# Patient Record
Sex: Male | Born: 1960
Health system: Southern US, Community
[De-identification: ages and names within clinical notes are randomized; demographics above are authoritative.]

## PROBLEM LIST (undated history)

## (undated) DIAGNOSIS — I10 Essential (primary) hypertension: Secondary | ICD-10-CM

## (undated) DIAGNOSIS — Z789 Other specified health status: Secondary | ICD-10-CM

## (undated) DIAGNOSIS — I119 Hypertensive heart disease without heart failure: Secondary | ICD-10-CM

## (undated) DIAGNOSIS — K611 Rectal abscess: Secondary | ICD-10-CM

## (undated) DIAGNOSIS — I700 Atherosclerosis of aorta: Secondary | ICD-10-CM

## (undated) DIAGNOSIS — M1990 Unspecified osteoarthritis, unspecified site: Secondary | ICD-10-CM

## (undated) DIAGNOSIS — J432 Centrilobular emphysema: Secondary | ICD-10-CM

## (undated) DIAGNOSIS — K644 Residual hemorrhoidal skin tags: Secondary | ICD-10-CM

## (undated) DIAGNOSIS — M545 Low back pain, unspecified: Secondary | ICD-10-CM

## (undated) DIAGNOSIS — D696 Thrombocytopenia, unspecified: Secondary | ICD-10-CM

## (undated) DIAGNOSIS — F1090 Alcohol use, unspecified, uncomplicated: Secondary | ICD-10-CM

## (undated) DIAGNOSIS — E785 Hyperlipidemia, unspecified: Secondary | ICD-10-CM

## (undated) DIAGNOSIS — F17200 Nicotine dependence, unspecified, uncomplicated: Secondary | ICD-10-CM

## (undated) DIAGNOSIS — G8929 Other chronic pain: Secondary | ICD-10-CM

## (undated) DIAGNOSIS — I2510 Atherosclerotic heart disease of native coronary artery without angina pectoris: Secondary | ICD-10-CM

## (undated) HISTORY — DX: Hyperlipidemia, unspecified: E78.5

## (undated) HISTORY — DX: Thrombocytopenia, unspecified: D69.6

## (undated) HISTORY — DX: Alcohol use, unspecified, uncomplicated: F10.90

## (undated) HISTORY — DX: Residual hemorrhoidal skin tags: K64.4

## (undated) HISTORY — DX: Nicotine dependence, unspecified, uncomplicated: F17.200

## (undated) HISTORY — DX: Centrilobular emphysema: J43.2

## (undated) HISTORY — DX: Essential (primary) hypertension: I10

## (undated) HISTORY — DX: Other chronic pain: G89.29

## (undated) HISTORY — DX: Low back pain, unspecified: M54.50

## (undated) HISTORY — DX: Unspecified osteoarthritis, unspecified site: M19.90

## (undated) HISTORY — DX: Atherosclerosis of aorta: I70.0

## (undated) HISTORY — DX: Hypertensive heart disease without heart failure: I11.9

## (undated) HISTORY — DX: Atherosclerotic heart disease of native coronary artery without angina pectoris: I25.10

## (undated) HISTORY — DX: Other specified health status: Z78.9

---

## 2002-12-10 ENCOUNTER — Emergency Department (HOSPITAL_COMMUNITY): Admission: EM | Admit: 2002-12-10 | Discharge: 2002-12-11 | Payer: Self-pay | Admitting: Emergency Medicine

## 2003-06-17 ENCOUNTER — Inpatient Hospital Stay (HOSPITAL_COMMUNITY): Admission: EM | Admit: 2003-06-17 | Discharge: 2003-06-20 | Payer: Self-pay | Admitting: Emergency Medicine

## 2005-11-24 ENCOUNTER — Encounter: Admission: RE | Admit: 2005-11-24 | Discharge: 2005-11-24 | Payer: Self-pay | Admitting: Family Medicine

## 2008-11-25 ENCOUNTER — Encounter: Admission: RE | Admit: 2008-11-25 | Discharge: 2008-11-25 | Payer: Self-pay | Admitting: Family Medicine

## 2008-12-11 ENCOUNTER — Ambulatory Visit: Payer: Self-pay | Admitting: Hematology & Oncology

## 2009-01-26 ENCOUNTER — Ambulatory Visit: Payer: Self-pay | Admitting: Hematology & Oncology

## 2009-02-12 LAB — CBC WITH DIFFERENTIAL (CANCER CENTER ONLY)
BASO#: 0 10*3/uL (ref 0.0–0.2)
BASO%: 0.7 % (ref 0.0–2.0)
EOS%: 1.1 % (ref 0.0–7.0)
HGB: 15.8 g/dL (ref 13.0–17.1)
LYMPH#: 1.8 10*3/uL (ref 0.9–3.3)
MCHC: 35.7 g/dL (ref 32.0–35.9)
MONO%: 12.3 % (ref 0.0–13.0)
NEUT#: 2.3 10*3/uL (ref 1.5–6.5)
Platelets: 142 10*3/uL — ABNORMAL LOW (ref 145–400)
RDW: 10.8 % (ref 10.5–14.6)

## 2009-02-12 LAB — TECHNOLOGIST REVIEW CHCC SATELLITE

## 2009-02-12 LAB — CHCC SATELLITE - SMEAR

## 2009-02-15 LAB — ANA: Anti Nuclear Antibody(ANA): NEGATIVE

## 2009-06-03 ENCOUNTER — Ambulatory Visit: Payer: Self-pay | Admitting: Hematology & Oncology

## 2012-02-20 ENCOUNTER — Encounter: Payer: Self-pay | Admitting: Family Medicine

## 2012-02-20 NOTE — Progress Notes (Signed)
This encounter was created in error - please disregard.

## 2012-04-09 ENCOUNTER — Emergency Department (INDEPENDENT_AMBULATORY_CARE_PROVIDER_SITE_OTHER)
Admission: EM | Admit: 2012-04-09 | Discharge: 2012-04-09 | Disposition: A | Discharge status: Home / Self Care | Payer: Self-pay | Source: Home / Self Care | Attending: Emergency Medicine | Admitting: Emergency Medicine

## 2012-04-09 ENCOUNTER — Emergency Department (INDEPENDENT_AMBULATORY_CARE_PROVIDER_SITE_OTHER): Payer: Self-pay

## 2012-04-09 ENCOUNTER — Encounter (HOSPITAL_COMMUNITY): Payer: Self-pay

## 2012-04-09 DIAGNOSIS — M5412 Radiculopathy, cervical region: Secondary | ICD-10-CM

## 2012-04-09 MED ORDER — MELOXICAM 7.5 MG PO TABS: 7.5000 mg | ORAL_TABLET | Freq: Every day | ORAL | Status: DC

## 2012-04-09 MED ORDER — CYCLOBENZAPRINE HCL 10 MG PO TABS: 10.0000 mg | ORAL_TABLET | Freq: Two times a day (BID) | ORAL | Status: DC | PRN

## 2012-04-09 NOTE — ED Notes (Signed)
2 week history of reported neck pain w/o known injury; NAD; cannot sleep on stomach as per his usual habit

## 2012-04-09 NOTE — ED Provider Notes (Addendum)
And her History     CSN: [id number]  Arrival date & time 04/09/12  1207   First MD Initiated Contact with Patient 04/09/12 1302      Chief Complaint  Patient presents with  . Neck Pain    (Consider location/radiation/quality/duration/timing/severity/associated sxs/prior treatment) HPI Comments: Patient presents urgent care this afternoon complaining of ongoing posterior neck pain radiates to the upper portion of his left shoulder in the lateral posterior aspect of his left upper arm. Patient describes that he's been hurting for approximately 2 weeks pain is exacerbated with movement neck rotation. Patient denies associated symptoms such as chest pains, shortness of breath, weakness. Patient denies any recent injuries or falls. Have been taken over-the-counter Aleve for the pain and discomfort. Patient denies constitutional symptoms such as fevers, unintentional weight loss, arthralgias myalgias or changes in appetite. Denies any previous injuries or fractures to his neck.  Patient is a 52 y.o. male presenting with neck pain. The history is provided by the patient.  Neck Pain  This is a new problem. The current episode started more than 1 week ago. The problem occurs constantly. The problem has been gradually worsening. There has been no fever. The pain is present in the left side. The quality of the pain is described as shooting. The pain is at a severity of 8/10. The pain is moderate. The symptoms are aggravated by bending and twisting (Moving his neck). Pertinent negatives include no photophobia, no visual change, no chest pain, no numbness, no bowel incontinence, no bladder incontinence, no paresis, no tingling and no weakness. Treatments tried: aleve.    Past Medical History  Diagnosis Date  . Arthritis   . HTN (hypertension)     History reviewed. No pertinent past surgical history.  Family History  Problem Relation Age of Onset  . Cancer Mother     Lung and breast  .  Hypertension Mother   . Heart failure Father   . Hypertension Father     History  Substance Use Topics  . Smoking status: Not on file  . Smokeless tobacco: Not on file  . Alcohol Use: Not on file      Review of Systems  Constitutional: Positive for activity change. Negative for chills, diaphoresis, appetite change and fatigue.  HENT: Positive for neck pain. Negative for ear pain, facial swelling, neck stiffness and tinnitus.   Eyes: Negative for photophobia.  Cardiovascular: Negative for chest pain.  Gastrointestinal: Negative for bowel incontinence.  Genitourinary: Negative for bladder incontinence.  Skin: Negative for color change, pallor, rash and wound.  Neurological: Negative for dizziness, tingling, facial asymmetry, weakness and numbness.    Allergies  Review of patient's allergies indicates no known allergies.  Home Medications   Current Outpatient Rx  Name  Route  Sig  Dispense  Refill  . AMLODIPINE BESYLATE-VALSARTAN 10-160 MG PO TABS   Oral   Take 1 tablet by mouth daily.         . CYCLOBENZAPRINE HCL 10 MG PO TABS   Oral   Take 1 tablet (10 mg total) by mouth 2 (two) times daily as needed for muscle spasms.   20 tablet   0   . MELOXICAM 7.5 MG PO TABS   Oral   Take 1 tablet (7.5 mg total) by mouth daily.   14 tablet   0     BP 197/100  Pulse 78  Temp 99.1 F (37.3 C) (Oral)  Resp 18  Ht 6' (1.829 m)  Wt 195  lb (88.451 kg)  BMI 26.45 kg/m2  SpO2 97%  Physical Exam  Nursing note and vitals reviewed. Constitutional: He is oriented to person, place, and time. Vital signs are normal. He appears well-developed and well-nourished.  Neck: Trachea normal and normal range of motion. Neck supple. No JVD present. Spinous process tenderness and muscular tenderness present. Carotid bruit is not present. No rigidity. No edema and no erythema present. No Brudzinski's sign and no Kernig's sign noted. No mass present.  Cardiovascular: Normal rate.  Exam  reveals no gallop and no friction rub.   No murmur heard. Pulmonary/Chest: Effort normal and breath sounds normal.  Musculoskeletal: He exhibits no tenderness.       Back:  Lymphadenopathy:    He has no cervical adenopathy.  Neurological: He is alert and oriented to person, place, and time. He displays normal reflexes. No cranial nerve deficit or sensory deficit. He exhibits normal muscle tone. Coordination normal.  Skin: Skin is warm. No rash noted. No erythema. No pallor.    ED Course  Procedures (including critical care time)  Labs Reviewed - No data to display Dg Cervical Spine Complete  04/09/2012  *RADIOLOGY REPORT*  Clinical Data: 52 year old male with neck pain radiating down left arm.  No known trauma.  CERVICAL SPINE - COMPLETE 4+ VIEW  Comparison: None  Findings: Reversal of the normal cervical lordosis is identified. There is no evidence of acute fracture, subluxation or prevertebral soft tissue swelling. Mild to moderate degenerative disc disease/spondylosis is present at C4-C5 and C5-C6 with mild to moderate left bony foraminal narrowing at C4-C5. No focal bony lesions are identified.  IMPRESSION: No evidence of acute bony abnormality.  Mild to moderate degenerative changes at C4-C5 and C5-C6 with mild to moderate left bony foraminal narrowing at C4-C5.   Original Report Authenticated By: Harmon Pier, M.D.      1. Cervical radiculopathy at C6       MDM   Non-trauma- cervical pain DJD- Cervical mild foramina narrowing C4-C5- NO vascular or muscular deficits-visual prescribing Cox 2 inhibitor along with muscle relaxer. Otherwise to followup with primary care Dr. for further followup and perhaps imaging study is to further correct her is a potential cervical radiculopathy. Patient does not have any muscular or vascular deficits.  Jimmie Molly, MD 04/09/12 [phone]

## 2012-04-30 ENCOUNTER — Encounter (HOSPITAL_COMMUNITY): Payer: Self-pay

## 2012-04-30 ENCOUNTER — Emergency Department (INDEPENDENT_AMBULATORY_CARE_PROVIDER_SITE_OTHER)
Admission: EM | Admit: 2012-04-30 | Discharge: 2012-04-30 | Disposition: A | Discharge status: Home / Self Care | Payer: Self-pay | Source: Home / Self Care | Attending: Family Medicine | Admitting: Family Medicine

## 2012-04-30 DIAGNOSIS — K602 Anal fissure, unspecified: Secondary | ICD-10-CM

## 2012-04-30 DIAGNOSIS — K6289 Other specified diseases of anus and rectum: Secondary | ICD-10-CM

## 2012-04-30 MED ORDER — HYDROCODONE-ACETAMINOPHEN 5-325 MG PO TABS: 1.0000 | ORAL_TABLET | Freq: Three times a day (TID) | ORAL | Status: DC | PRN

## 2012-04-30 MED ORDER — DOCUSATE SODIUM 100 MG PO CAPS: 100.0000 mg | ORAL_CAPSULE | Freq: Two times a day (BID) | ORAL | Status: DC

## 2012-04-30 NOTE — ED Notes (Signed)
Patient states has a lump on the base of his spine that flarred up last night Pain is a number 9

## 2012-04-30 NOTE — ED Provider Notes (Signed)
History   CSN: [id number]  Arrival date & time 04/30/12  1527   First MD Initiated Contact with Patient 04/30/12 1626     Chief Complaint  Patient presents with  . Tailbone Pain   HPI Pt says that he is having anal pain for one week.  He reports that he started developing pain when he was placed on Mobic for an arthritis condition.  He took Mobic for about 14 days.  He reported that he has had pain for the last week and his anus that has gotten worse.  He reports now he is having pain with defecation.  He reported that he had initially developed a rash on his anus a couple of days ago before the severe pain started.  He reports that he has not been having a difficult time having bowel movements but reports that he is having pain with defecation that started last evening.  He reports that he hasn't been able to sleep well because of pain in the anus.  He reports no blood in the stool.  No black tarry stool.  No hematemesis.  Patient denies any trauma to the anal canal.  Past Medical History  Diagnosis Date  . Arthritis   . HTN (hypertension)     History reviewed. No pertinent past surgical history.  Family History  Problem Relation Age of Onset  . Cancer Mother     Lung and breast  . Hypertension Mother   . Heart failure Father   . Hypertension Father     History  Substance Use Topics  . Smoking status: Not on file  . Smokeless tobacco: Not on file  . Alcohol Use: Not on file    Review of Systems  Gastrointestinal: Positive for diarrhea and rectal pain. Negative for nausea, vomiting, abdominal pain, constipation, blood in stool, abdominal distention and anal bleeding.  All other systems reviewed and are negative.   Allergies  Review of patient's allergies indicates no known allergies.  Home Medications   Current Outpatient Rx  Name  Route  Sig  Dispense  Refill  . amLODipine-valsartan (EXFORGE) 10-160 MG per tablet   Oral   Take 1 tablet by mouth daily.         .  cyclobenzaprine (FLEXERIL) 10 MG tablet   Oral   Take 1 tablet (10 mg total) by mouth 2 (two) times daily as needed for muscle spasms.   20 tablet   0   . meloxicam (MOBIC) 7.5 MG tablet   Oral   Take 1 tablet (7.5 mg total) by mouth daily.   14 tablet   0     BP 138/81  Pulse 76  Temp(Src) 98.3 F (36.8 C) (Oral)  SpO2 98%  Physical Exam  Nursing note and vitals reviewed. Constitutional: He is oriented to person, place, and time. He appears well-developed and well-nourished. No distress.  HENT:  Head: Normocephalic and atraumatic.  Eyes: EOM are normal. Pupils are equal, round, and reactive to light.  Neck: Normal range of motion.  Cardiovascular: Normal rate, regular rhythm and normal heart sounds.   Pulmonary/Chest: Effort normal and breath sounds normal.  Abdominal: Soft. Bowel sounds are normal. He exhibits no distension and no mass. There is no tenderness. There is no rebound and no guarding.  Genitourinary:  Anal exam: No external lesions seen, small anal fissure noted at 6:00 position, guaiac-negative, no external hemorrhoids or internal hemorrhoids could be palpated, patient has significant pain during the exam at the area of  the fissure  Musculoskeletal: Normal range of motion.  Neurological: He is alert and oriented to person, place, and time. He has normal reflexes.  Skin: Skin is warm and dry.  Psychiatric: He has a normal mood and affect. His behavior is normal. Judgment and thought content normal.    ED Course  Procedures (including critical care time)  Labs Reviewed - No data to display No results found.   No diagnosis found.   MDM  IMPRESSION  Anal fissure   Anal Pain   Proctitis  RECOMMENDATIONS / PLAN High Fiber Diet Sitz Baths twice a day Hydrocodone acetaminophen 5/325 one by mouth q. 6 hours when necessary severe pain Discontinue Mobic as this may have caused ulceration in the GI tract Colace 100 mg po bid Increase fiber in diet   RTC or go to ER if symptoms don't improve or worsen over next 2 days .  Pt and wife verbalized understanding.   FOLLOW UP On Monday for recheck  The patient was given clear instructions to go to ER or return to medical center if symptoms don't improve, worsen or new problems develop.  The patient verbalized understanding.  The patient was told to call to get lab results if they haven't heard anything in the next week.            Cleora Fleet, MD 04/30/12 2005

## 2012-05-06 NOTE — ED Notes (Signed)
Per dr Laural Benes if patient is still in that much pain suggest he be seen in the ED so he can get a surgeon consult for what is going on in his anus area

## 2012-10-13 ENCOUNTER — Encounter (HOSPITAL_COMMUNITY): Payer: Self-pay | Admitting: *Deleted

## 2012-10-13 ENCOUNTER — Inpatient Hospital Stay (HOSPITAL_COMMUNITY)
Admission: EM | Admit: 2012-10-13 | Discharge: 2012-10-16 | DRG: 349 | Disposition: A | Discharge status: Home / Self Care | Payer: MEDICAID | Attending: General Surgery | Admitting: General Surgery

## 2012-10-13 DIAGNOSIS — K612 Anorectal abscess: Principal | ICD-10-CM | POA: Diagnosis present

## 2012-10-13 DIAGNOSIS — I10 Essential (primary) hypertension: Secondary | ICD-10-CM | POA: Diagnosis present

## 2012-10-13 DIAGNOSIS — K611 Rectal abscess: Secondary | ICD-10-CM

## 2012-10-13 DIAGNOSIS — K6289 Other specified diseases of anus and rectum: Secondary | ICD-10-CM

## 2012-10-13 MED ORDER — MORPHINE SULFATE 4 MG/ML IJ SOLN
4.0000 mg | Freq: Once | INTRAMUSCULAR | Status: AC
  Administered 2012-10-14: 4 mg via INTRAVENOUS
  Filled 2012-10-13: qty 1

## 2012-10-13 NOTE — ED Notes (Signed)
The pt reports that he has swelling and pain in   His rectum today.  He has also had the same in the past

## 2012-10-13 NOTE — ED Provider Notes (Signed)
CSN: [id number]     Arrival date & time 10/13/12  2224 History     First MD Initiated Contact with Patient 10/13/12 2300     Chief Complaint  Patient presents with  . Hemorrhoids   (Consider location/radiation/quality/duration/timing/severity/associated sxs/prior Treatment) HPI History provided by pt.   52yo M w/ h/o HTN, otherwise healthy, presents w/ 2 days of rectal pain.  Aggravated by sitting and having a BM.  Associated w/ difficulty initiating urinary stream and weakened stream.  Denies fever, abd pain, N/V, hematochezia/melena, dysuria, hematuria and urethral discharge.  Per prior chart, seen for similar sx at [facility] on 04/30/12 and was diagnosed w/ anal fissure and proctitis.  No h/o prostatitis or BPH.   Past Medical History  Diagnosis Date  . Arthritis   . HTN (hypertension)    History reviewed. No pertinent past surgical history. Family History  Problem Relation Age of Onset  . Cancer Mother     Lung and breast  . Hypertension Mother   . Heart failure Father   . Hypertension Father    History  Substance Use Topics  . Smoking status: Not on file  . Smokeless tobacco: Not on file  . Alcohol Use: Not on file    Review of Systems  All other systems reviewed and are negative.    Allergies  Review of patient's allergies indicates no known allergies.  Home Medications   Current Outpatient Rx  Name  Route  Sig  Dispense  Refill  . amLODipine-valsartan (EXFORGE) 10-160 MG per tablet   Oral   Take 1 tablet by mouth daily.         . hydrocortisone cream 1 %   Topical   Apply 1 application topically 2 (two) times daily.         Marland Kitchen ibuprofen (ADVIL,MOTRIN) 200 MG tablet   Oral   Take 200 mg by mouth every 6 (six) hours as needed for pain.          BP 148/89  Pulse 89  Temp(Src) 98.3 F (36.8 C) (Oral)  Resp 16  SpO2 97% Physical Exam  Nursing note and vitals reviewed. Constitutional: He is oriented to person, place, and time. He appears  well-developed and well-nourished. No distress.  HENT:  Head: Normocephalic and atraumatic.  Eyes:  Normal appearance  Neck: Normal range of motion.  Cardiovascular: Normal rate and regular rhythm.   Pulmonary/Chest: Effort normal and breath sounds normal. No respiratory distress.  Abdominal: Soft. Bowel sounds are normal. He exhibits no distension and no mass. There is no tenderness. There is no rebound and no guarding.  Genitourinary:  No CVA tenderness.  No external hemorrhoids, fissures or other perianal skin changes.  Induration to left of anus and left medial buttock that can also be appreciated on DRE.  Severe pain on DRE.  Unable to assess prostate.  Nml stool color.    Musculoskeletal: Normal range of motion.  Neurological: He is alert and oriented to person, place, and time.  Skin: Skin is warm and dry. No rash noted.  Psychiatric: He has a normal mood and affect. His behavior is normal.    ED Course   Procedures (including critical care time)  Labs Reviewed  CBC WITH DIFFERENTIAL - Abnormal; Notable for the following:    Hemoglobin 17.2 (*)    Monocytes Relative 15 (*)    Monocytes Absolute 1.4 (*)    All other components within normal limits  COMPREHENSIVE METABOLIC PANEL - Abnormal; Notable for the  following:    Glucose, Bld 109 (*)    Albumin 3.0 (*)    All other components within normal limits  URINALYSIS, ROUTINE W REFLEX MICROSCOPIC   Ct Pelvis W Contrast  10/14/2012   *RADIOLOGY REPORT*  Clinical Data:  Hemorrhoids.  Pain and swelling in the rectum.  CT PELVIS WITH CONTRAST  Technique:  Multidetector CT imaging of the pelvis was performed using the standard protocol following the bolus administration of intravenous contrast.  Contrast: OMNIPAQUE IOHEXOL 300 MG/ML  SOLN  Comparison:   None.  Findings:  The dominant abnormality of the fluid collection which appears intramural in the rectum.  The there is no extension to the skin surface identified.  On axial  imaging, this measures 23 mm x 17 mm.  Craniocaudal extent is 43 mm.  This appears to be in the posterior rectal wall.  Inflammatory changes of the distal sigmoid and proximal rectum are present.  Inflammatory changes of the more superior rectum are present with stranding in the pararectal fat. There is no colonic obstruction.  Visualized ascending and descending colon appear normal.  Small bowel appears normal. Vasculature demonstrates atherosclerosis without aneurysm or acute abnormality.  Probable right varicocele.  Visualized portions of the kidneys appear within normal limits.  No aggressive osseous lesions.  Moderate bilateral hip osteoarthritis.  IMPRESSION:  1.  Fluid collection in the inferior rectal wall most compatible with abscess.  No definite extension to the perineum. 2.  Proctitis.   Original Report Authenticated By: Andreas Newport, M.D.   1. Rectal abscess   2. Proctitis     MDM  51yo immunocompetent M presents w/ 2 days of rectal pain w/ associated urinary hesitancy and weakened stream.  On exam, afebrile, NAD, abd benign, induration and severe tenderness just left of anus and left/posterior wall of rectum.  Unable to assess the prostate d/t pain level on DRE.  CT pelvis ordered for further evaluation; suspect perirectal abscess.  U/A pending as well.  Pt to receive morphine for pain.  12:00 AM   CT shows inferior rectal wall abscess and proctitis.  Consulted Dr. Corliss Skains who requested that Dr. Preston Fleeting evaluate.  Dr. Preston Fleeting requested that he see patient in ED.  Pt aware of diagnosis.  His pain is currently controlled. 2:00 AM   Otilio Miu, PA-C 10/14/12 0201

## 2012-10-14 ENCOUNTER — Encounter (HOSPITAL_COMMUNITY): Payer: Self-pay | Admitting: Anesthesiology

## 2012-10-14 ENCOUNTER — Encounter (HOSPITAL_COMMUNITY): Admission: EM | Disposition: A | Discharge status: Home / Self Care | Payer: Self-pay | Source: Home / Self Care

## 2012-10-14 ENCOUNTER — Emergency Department (HOSPITAL_COMMUNITY): Payer: Self-pay

## 2012-10-14 ENCOUNTER — Observation Stay (HOSPITAL_COMMUNITY): Payer: Self-pay | Admitting: Anesthesiology

## 2012-10-14 ENCOUNTER — Encounter (HOSPITAL_COMMUNITY): Payer: Self-pay | Admitting: Radiology

## 2012-10-14 DIAGNOSIS — K612 Anorectal abscess: Secondary | ICD-10-CM

## 2012-10-14 DIAGNOSIS — K611 Rectal abscess: Secondary | ICD-10-CM

## 2012-10-14 HISTORY — PX: INCISION AND DRAINAGE PERIRECTAL ABSCESS: SHX1804

## 2012-10-14 LAB — CBC WITH DIFFERENTIAL/PLATELET
Hemoglobin: 17.2 g/dL — ABNORMAL HIGH (ref 13.0–17.0)
Lymphocytes Relative: 14 % (ref 12–46)
Lymphs Abs: 1.3 10*3/uL (ref 0.7–4.0)
Monocytes Relative: 15 % — ABNORMAL HIGH (ref 3–12)
Neutro Abs: 6.7 10*3/uL (ref 1.7–7.7)
Neutrophils Relative %: 71 % (ref 43–77)
Platelets: 162 10*3/uL (ref 150–400)
RBC: 5.18 MIL/uL (ref 4.22–5.81)
WBC: 9.4 10*3/uL (ref 4.0–10.5)

## 2012-10-14 LAB — COMPREHENSIVE METABOLIC PANEL
ALT: 11 U/L (ref 0–53)
Alkaline Phosphatase: 71 U/L (ref 39–117)
BUN: 10 mg/dL (ref 6–23)
Chloride: 103 mEq/L (ref 96–112)
GFR calc Af Amer: >90 mL/min (ref >90)
Glucose, Bld: 109 mg/dL — ABNORMAL HIGH (ref 70–99)
Potassium: 4.4 mEq/L (ref 3.5–5.1)
Sodium: 138 mEq/L (ref 135–145)
Total Bilirubin: 0.7 mg/dL (ref 0.3–1.2)
Total Protein: 7.1 g/dL (ref 6.0–8.3)

## 2012-10-14 LAB — URINALYSIS, ROUTINE W REFLEX MICROSCOPIC
Bilirubin Urine: NEGATIVE
Glucose, UA: NEGATIVE mg/dL
Ketones, ur: 15 mg/dL — AB
Leukocytes, UA: NEGATIVE
Nitrite: NEGATIVE
Protein, ur: NEGATIVE mg/dL
pH: 5.5 (ref 5.0–8.0)

## 2012-10-14 LAB — URINE MICROSCOPIC-ADD ON

## 2012-10-14 LAB — SURGICAL PCR SCREEN
MRSA, PCR: NEGATIVE
Staphylococcus aureus: NEGATIVE

## 2012-10-14 SURGERY — INCISION AND DRAINAGE, ABSCESS, PERIRECTAL
Anesthesia: General | Site: Buttocks | Wound class: Dirty or Infected

## 2012-10-14 MED ORDER — OXYCODONE HCL 5 MG/5ML PO SOLN: 5.0000 mg | Freq: Once | ORAL | Status: AC | PRN

## 2012-10-14 MED ORDER — SODIUM CHLORIDE 0.9 % IV SOLN
INTRAVENOUS | Status: DC
  Administered 2012-10-14 – 2012-10-15 (×4): via INTRAVENOUS

## 2012-10-14 MED ORDER — ACETAMINOPHEN 325 MG PO TABS
ORAL_TABLET | ORAL | Status: AC
  Administered 2012-10-14: 650 mg via ORAL
  Filled 2012-10-14: qty 2

## 2012-10-14 MED ORDER — LIDOCAINE HCL (CARDIAC) 20 MG/ML IV SOLN
INTRAVENOUS | Status: DC | PRN
  Administered 2012-10-14: 80 mg via INTRAVENOUS

## 2012-10-14 MED ORDER — IOHEXOL 300 MG/ML  SOLN
100.0000 mL | Freq: Once | INTRAMUSCULAR | Status: AC | PRN
  Administered 2012-10-14: 100 mL via INTRAVENOUS

## 2012-10-14 MED ORDER — ONDANSETRON HCL 4 MG/2ML IJ SOLN
INTRAMUSCULAR | Status: DC | PRN
  Administered 2012-10-14: 4 mg via INTRAVENOUS

## 2012-10-14 MED ORDER — CIPROFLOXACIN IN D5W 400 MG/200ML IV SOLN
400.0000 mg | Freq: Two times a day (BID) | INTRAVENOUS | Status: DC
  Administered 2012-10-14 – 2012-10-16 (×5): 400 mg via INTRAVENOUS
  Filled 2012-10-14 (×6): qty 200

## 2012-10-14 MED ORDER — METRONIDAZOLE IN NACL 5-0.79 MG/ML-% IV SOLN
500.0000 mg | Freq: Three times a day (TID) | INTRAVENOUS | Status: DC
  Administered 2012-10-14 – 2012-10-16 (×7): 500 mg via INTRAVENOUS
  Filled 2012-10-14 (×9): qty 100

## 2012-10-14 MED ORDER — PROPOFOL 10 MG/ML IV BOLUS
INTRAVENOUS | Status: DC | PRN
  Administered 2012-10-14: 200 mg via INTRAVENOUS

## 2012-10-14 MED ORDER — LACTATED RINGERS IV SOLN
INTRAVENOUS | Status: DC | PRN
  Administered 2012-10-14: 09:00:00 via INTRAVENOUS

## 2012-10-14 MED ORDER — IRBESARTAN 150 MG PO TABS
150.0000 mg | ORAL_TABLET | Freq: Every day | ORAL | Status: DC
  Administered 2012-10-15 – 2012-10-16 (×2): 150 mg via ORAL
  Filled 2012-10-14 (×3): qty 1

## 2012-10-14 MED ORDER — PANTOPRAZOLE SODIUM 40 MG IV SOLR
40.0000 mg | Freq: Every day | INTRAVENOUS | Status: DC
  Administered 2012-10-14: 40 mg via INTRAVENOUS
  Filled 2012-10-14 (×3): qty 40

## 2012-10-14 MED ORDER — BUPIVACAINE-EPINEPHRINE PF 0.25-1:200000 % IJ SOLN
INTRAMUSCULAR | Status: AC
  Filled 2012-10-14: qty 30

## 2012-10-14 MED ORDER — PHENYLEPHRINE HCL 10 MG/ML IJ SOLN
INTRAMUSCULAR | Status: DC | PRN
  Administered 2012-10-14 (×2): 80 ug via INTRAVENOUS

## 2012-10-14 MED ORDER — AMLODIPINE BESYLATE-VALSARTAN 10-160 MG PO TABS: 1.0000 | ORAL_TABLET | Freq: Every day | ORAL | Status: DC

## 2012-10-14 MED ORDER — HYDROMORPHONE HCL PF 1 MG/ML IJ SOLN: 0.2500 mg | INTRAMUSCULAR | Status: DC | PRN

## 2012-10-14 MED ORDER — ONDANSETRON HCL 4 MG/2ML IJ SOLN: 4.0000 mg | Freq: Four times a day (QID) | INTRAMUSCULAR | Status: DC | PRN

## 2012-10-14 MED ORDER — BUPIVACAINE-EPINEPHRINE 0.25% -1:200000 IJ SOLN
INTRAMUSCULAR | Status: DC | PRN
  Administered 2012-10-14: 20 mL

## 2012-10-14 MED ORDER — 0.9 % SODIUM CHLORIDE (POUR BTL) OPTIME
TOPICAL | Status: DC | PRN
  Administered 2012-10-14: 1000 mL

## 2012-10-14 MED ORDER — HYDROMORPHONE HCL PF 1 MG/ML IJ SOLN
1.0000 mg | INTRAMUSCULAR | Status: DC | PRN
  Administered 2012-10-15 (×2): 1 mg via INTRAVENOUS
  Filled 2012-10-14 (×2): qty 1

## 2012-10-14 MED ORDER — FENTANYL CITRATE 0.05 MG/ML IJ SOLN
INTRAMUSCULAR | Status: DC | PRN
  Administered 2012-10-14: 100 ug via INTRAVENOUS
  Administered 2012-10-14 (×2): 50 ug via INTRAVENOUS

## 2012-10-14 MED ORDER — KETOROLAC TROMETHAMINE 30 MG/ML IJ SOLN
INTRAMUSCULAR | Status: DC | PRN
  Administered 2012-10-14: 30 mg via INTRAVENOUS

## 2012-10-14 MED ORDER — AMLODIPINE BESYLATE 10 MG PO TABS
10.0000 mg | ORAL_TABLET | Freq: Every day | ORAL | Status: DC
  Administered 2012-10-15 – 2012-10-16 (×2): 10 mg via ORAL
  Filled 2012-10-14 (×3): qty 1

## 2012-10-14 MED ORDER — ACETAMINOPHEN 325 MG PO TABS: 650.0000 mg | ORAL_TABLET | Freq: Four times a day (QID) | ORAL | Status: AC | PRN

## 2012-10-14 MED ORDER — MIDAZOLAM HCL 5 MG/5ML IJ SOLN
INTRAMUSCULAR | Status: DC | PRN
  Administered 2012-10-14: 2 mg via INTRAVENOUS

## 2012-10-14 MED ORDER — OXYCODONE HCL 5 MG PO TABS
5.0000 mg | ORAL_TABLET | ORAL | Status: DC | PRN
  Administered 2012-10-14 – 2012-10-16 (×8): 10 mg via ORAL
  Filled 2012-10-14 (×2): qty 1
  Filled 2012-10-14 (×7): qty 2

## 2012-10-14 MED ORDER — OXYCODONE HCL 5 MG PO TABS: 5.0000 mg | ORAL_TABLET | Freq: Once | ORAL | Status: AC | PRN

## 2012-10-14 SURGICAL SUPPLY — 39 items
BLADE SURG 15 STRL LF DISP TIS (BLADE) ×1 IMPLANT
BLADE SURG 15 STRL SS (BLADE) ×2
CANISTER SUCTION 2500CC (MISCELLANEOUS) ×2 IMPLANT
CLEANER TIP ELECTROSURG 2X2 (MISCELLANEOUS) IMPLANT
CLOTH BEACON ORANGE TIMEOUT ST (SAFETY) ×2 IMPLANT
COVER SURGICAL LIGHT HANDLE (MISCELLANEOUS) ×2 IMPLANT
DRAPE UTILITY 15X26 W/TAPE STR (DRAPE) ×4 IMPLANT
DRSG PAD ABDOMINAL 8X10 ST (GAUZE/BANDAGES/DRESSINGS) ×2 IMPLANT
ELECT REM PT RETURN 9FT ADLT (ELECTROSURGICAL)
ELECTRODE REM PT RTRN 9FT ADLT (ELECTROSURGICAL) IMPLANT
GAUZE PACKING IODOFORM 1 (PACKING) ×2 IMPLANT
GAUZE SPONGE 4X4 16PLY XRAY LF (GAUZE/BANDAGES/DRESSINGS) ×2 IMPLANT
GLOVE BIO SURGEON STRL SZ7.5 (GLOVE) ×2 IMPLANT
GLOVE BIO SURGEON STRL SZ8 (GLOVE) ×2 IMPLANT
GLOVE BIOGEL PI IND STRL 7.5 (GLOVE) ×1 IMPLANT
GLOVE BIOGEL PI IND STRL 8 (GLOVE) ×1 IMPLANT
GLOVE BIOGEL PI INDICATOR 7.5 (GLOVE) ×1
GLOVE BIOGEL PI INDICATOR 8 (GLOVE) ×1
GOWN STRL NON-REIN LRG LVL3 (GOWN DISPOSABLE) ×4 IMPLANT
GOWN STRL REIN XL XLG (GOWN DISPOSABLE) ×2 IMPLANT
KIT BASIN OR (CUSTOM PROCEDURE TRAY) ×2 IMPLANT
KIT ROOM TURNOVER OR (KITS) ×2 IMPLANT
NEEDLE HYPO 25GX1X1/2 BEV (NEEDLE) ×2 IMPLANT
NS IRRIG 1000ML POUR BTL (IV SOLUTION) ×2 IMPLANT
PACK LITHOTOMY IV (CUSTOM PROCEDURE TRAY) ×2 IMPLANT
PAD ARMBOARD 7.5X6 YLW CONV (MISCELLANEOUS) ×4 IMPLANT
PENCIL BUTTON HOLSTER BLD 10FT (ELECTRODE) IMPLANT
SPONGE GAUZE 4X4 12PLY (GAUZE/BANDAGES/DRESSINGS) ×2 IMPLANT
SWAB COLLECTION DEVICE MRSA (MISCELLANEOUS) ×2 IMPLANT
SYR BULB 3OZ (MISCELLANEOUS) ×2 IMPLANT
SYR CONTROL 10ML LL (SYRINGE) ×2 IMPLANT
TAPE CLOTH SURG 6X10 WHT LF (GAUZE/BANDAGES/DRESSINGS) ×2 IMPLANT
TOWEL OR 17X24 6PK STRL BLUE (TOWEL DISPOSABLE) ×2 IMPLANT
TOWEL OR 17X26 10 PK STRL BLUE (TOWEL DISPOSABLE) ×2 IMPLANT
TUBE ANAEROBIC SPECIMEN COL (MISCELLANEOUS) ×2 IMPLANT
TUBE CONNECTING 12X1/4 (SUCTIONS) ×2 IMPLANT
UNDERPAD 30X30 INCONTINENT (UNDERPADS AND DIAPERS) ×2 IMPLANT
WATER STERILE IRR 1000ML POUR (IV SOLUTION) ×2 IMPLANT
YANKAUER SUCT BULB TIP NO VENT (SUCTIONS) ×2 IMPLANT

## 2012-10-14 NOTE — Transfer of Care (Signed)
Immediate Anesthesia Transfer of Care Note  Patient: Eric Nielsen  Procedure(s) Performed: Procedure(s): IRRIGATION AND DEBRIDEMENT PERIRECTAL ABSCESS (N/A)  Patient Location: PACU  Anesthesia Type:General  Level of Consciousness: awake, alert  and patient cooperative  Airway & Oxygen Therapy: Patient Spontanous Breathing and Patient connected to nasal cannula oxygen  Post-op Assessment: Report given to PACU RN and Post -op Vital signs reviewed and stable  Post vital signs: Reviewed and stable  Complications: No apparent anesthesia complications

## 2012-10-14 NOTE — Op Note (Addendum)
10/13/2012 - 10/14/2012  9:55 AM  PATIENT:  Eric Nielsen  52 y.o. male  PRE-OPERATIVE DIAGNOSIS:  Left Peri Rectal Abscess  POST-OPERATIVE DIAGNOSIS:  Left Peri Rectal Abscess  PROCEDURE:  Procedure(s): IRRIGATION AND DEBRIDEMENT PERIRECTAL ABSCESS  SURGEON:  Surgeon(s): Liz Malady, MD  PHYSICIAN ASSISTANT:   ASSISTANTS: none   ANESTHESIA:   local and general  EBL:  Total I/O In: 0  Out: 650 [Urine:600; Blood:50]  BLOOD ADMINISTERED:none  DRAINS: none   SPECIMEN:  No Specimen  DISPOSITION OF SPECIMEN:  N/A  COUNTS:  YES  DICTATION: .Dragon Dictation  Patient presents for incision and drainage of perirectal abscess. Informed consent was obtained. He is on IV antibiotic protocol. He was brought to the operating room and general endotracheal anesthesia was administered by the anesthesia staff. His placed in lithotomy position.. A region was prepped and draped in sterile fashion. We did time out procedure. Examination revealed a fluctuant area to the left side of his anal opening. This corresponded with some fullness felt towards the left posterior region on digital rectal exam as well. Linear incision was made on the left side in the perirectal region. Subcutaneous tissues were dissected down and quickly entered a large cavity. This was filled with bloody purulent material. This was sent for culture. Some loculations in the cavity were broken up. The wound was then copiously irrigated. Hemostasis in the subcutaneous tissues was obtained with cautery.Local anesthetic was injected liberally around the wound. The cavity was then packed with one-inch iodoform gauze. There was good hemostasis. A sterile dressing was placed. All counts were correct.He tolerated procedure without apparent complication and was taken recovery in stable condition.  PATIENT DISPOSITION:  PACU - hemodynamically stable.   Delay start of Pharmacological VTE agent (>24hrs) due to surgical blood loss or  risk of bleeding:  no  Violeta Gelinas, MD, MPH, FACS Pager: [phone]  7/28/20149:55 AM

## 2012-10-14 NOTE — Progress Notes (Signed)
  Subjective: Pain perirectal area on L  Objective: Vital signs in last 24 hours: Temp:  [98.3 F (36.8 C)-99 F (37.2 C)] 99 F (37.2 C) (07/28 0430) Pulse Rate:  [71-89] 85 (07/28 0430) Resp:  [16-18] 18 (07/28 0430) BP: (134-163)/(72-98) 153/90 mmHg (07/28 0430) SpO2:  [96 %-100 %] 99 % (07/28 0430)    Intake/Output from previous day:   Intake/Output this shift:    General appearance: alert and cooperative Resp: clear to auscultation bilaterally Cardio: regular rate and rhythm GI: soft, NT palpable induration L posterior perirectal area  Lab Results:   Recent Labs  10/13/12 2354  WBC 9.4  HGB 17.2*  HCT 48.2  PLT 162   BMET  Recent Labs  10/13/12 2354  NA 138  K 4.4  CL 103  CO2 30  GLUCOSE 109*  BUN 10  CREATININE 0.99  CALCIUM 9.4   PT/INR No results found for this basename: LABPROT, INR,  in the last 72 hours ABG No results found for this basename: PHART, PCO2, PO2, HCO3,  in the last 72 hours  Studies/Results: Ct Pelvis W Contrast  10/14/2012   *RADIOLOGY REPORT*  Clinical Data:  Hemorrhoids.  Pain and swelling in the rectum.  CT PELVIS WITH CONTRAST  Technique:  Multidetector CT imaging of the pelvis was performed using the standard protocol following the bolus administration of intravenous contrast.  Contrast: OMNIPAQUE IOHEXOL 300 MG/ML  SOLN  Comparison:   None.  Findings:  The dominant abnormality of the fluid collection which appears intramural in the rectum.  The there is no extension to the skin surface identified.  On axial imaging, this measures 23 mm x 17 mm.  Craniocaudal extent is 43 mm.  This appears to be in the posterior rectal wall.  Inflammatory changes of the distal sigmoid and proximal rectum are present.  Inflammatory changes of the more superior rectum are present with stranding in the pararectal fat. There is no colonic obstruction.  Visualized ascending and descending colon appear normal.  Small bowel appears normal.  Vasculature demonstrates atherosclerosis without aneurysm or acute abnormality.  Probable right varicocele.  Visualized portions of the kidneys appear within normal limits.  No aggressive osseous lesions.  Moderate bilateral hip osteoarthritis.  IMPRESSION:  1.  Fluid collection in the inferior rectal wall most compatible with abscess.  No definite extension to the perineum. 2.  Proctitis.   Original Report Authenticated By: Andreas Newport, M.D.    Anti-infectives: Anti-infectives   Start     Dose/Rate Route Frequency Ordered Stop   10/14/12 0600  metroNIDAZOLE (FLAGYL) IVPB 500 mg     500 mg 100 mL/hr over 60 Minutes Intravenous Every 8 hours 10/14/12 0439     10/14/12 0500  ciprofloxacin (CIPRO) IVPB 400 mg     400 mg 200 mL/hr over 60 Minutes Intravenous Every 12 hours 10/14/12 0439        Assessment/Plan: Perirectal abscess - for I&D in the OR this AM. Procedure, risks, benefits D/W patient. He agrees.  We also discussed the expected post-op course.  LOS: 1 day    Gailene Youkhana E 10/14/2012

## 2012-10-14 NOTE — Preoperative (Signed)
Beta Blockers   Reason not to administer Beta Blockers:Not Applicable 

## 2012-10-14 NOTE — ED Notes (Signed)
Family at bedside. 

## 2012-10-14 NOTE — Anesthesia Procedure Notes (Signed)
Procedure Name: LMA Insertion Date/Time: 10/14/2012 9:31 AM Performed by: Leona Singleton A Pre-anesthesia Checklist: Patient identified, Emergency Drugs available, Suction available and Patient being monitored Patient Re-evaluated:Patient Re-evaluated prior to inductionOxygen Delivery Method: Circle system utilized Preoxygenation: Pre-oxygenation with 100% oxygen Intubation Type: IV induction Ventilation: Mask ventilation without difficulty LMA: LMA inserted LMA Size: 5.0 Tube type: Oral Number of attempts: 1 Placement Confirmation: positive ETCO2 and breath sounds checked- equal and bilateral Tube secured with: Tape Dental Injury: Teeth and Oropharynx as per pre-operative assessment

## 2012-10-14 NOTE — Anesthesia Postprocedure Evaluation (Signed)
  Anesthesia Post-op Note  Patient: Eric Nielsen  Procedure(s) Performed: Procedure(s): IRRIGATION AND DEBRIDEMENT PERIRECTAL ABSCESS (N/A)  Patient Location: PACU  Anesthesia Type:General  Level of Consciousness: awake, alert  and oriented  Airway and Oxygen Therapy: Patient Spontanous Breathing and Patient connected to nasal cannula oxygen  Post-op Pain: none  Post-op Assessment: Post-op Vital signs reviewed, Patient's Cardiovascular Status Stable, Respiratory Function Stable, Patent Airway and No signs of Nausea or vomiting  Post-op Vital Signs: Reviewed and stable  Complications: No apparent anesthesia complications

## 2012-10-14 NOTE — H&P (Signed)
Eric Nielsen is an 52 y.o. male.   Chief Complaint: rectal pain HPI: 52 yo male with HTN presents with three days of worsening rectal pain.  The pain is only present when he sits down or when he tries to have a bowel movement.  He has had some difficulty with urination as well.  He came to the ED for evaluation - afebrile, nl WBC.  CT scan shows a perirectal, possibly intramural abscess in the posterior wall of the rectum.  No clear extension to the perineal skin.  He had a similar episode in February of 2014 and was diagnosed with an anal fissure.  That pain resolved within a few days.  Past Medical History  Diagnosis Date  . Arthritis   . HTN (hypertension)     History reviewed. No pertinent past surgical history.  Family History  Problem Relation Age of Onset  . Cancer Mother     Lung and breast  . Hypertension Mother   . Heart failure Father   . Hypertension Father    Social History:  has no tobacco, alcohol, and drug history on file.  Allergies: No Known Allergies  Prior to Admission medications   Medication Sig Start Date End Date Taking? Authorizing Provider  amLODipine-valsartan (EXFORGE) 10-160 MG per tablet Take 1 tablet by mouth daily.   Yes Historical Provider, MD  hydrocortisone cream 1 % Apply 1 application topically 2 (two) times daily.   Yes Historical Provider, MD  ibuprofen (ADVIL,MOTRIN) 200 MG tablet Take 200 mg by mouth every 6 (six) hours as needed for pain.   Yes Historical Provider, MD    Results for orders placed during the hospital encounter of 10/13/12 (from the past 48 hour(s))  CBC WITH DIFFERENTIAL     Status: Abnormal   Collection Time    10/13/12 11:54 PM      Result Value Range   WBC 9.4  4.0 - 10.5 K/uL   RBC 5.18  4.22 - 5.81 MIL/uL   Hemoglobin 17.2 (*) 13.0 - 17.0 g/dL   HCT 16.1  09.6 - 04.5 %   MCV 93.1  78.0 - 100.0 fL   MCH 33.2  26.0 - 34.0 pg   MCHC 35.7  30.0 - 36.0 g/dL   RDW 40.9  81.1 - 91.4 %   Platelets 162  150 - 400  K/uL   Neutrophils Relative % 71  43 - 77 %   Neutro Abs 6.7  1.7 - 7.7 K/uL   Lymphocytes Relative 14  12 - 46 %   Lymphs Abs 1.3  0.7 - 4.0 K/uL   Monocytes Relative 15 (*) 3 - 12 %   Monocytes Absolute 1.4 (*) 0.1 - 1.0 K/uL   Eosinophils Relative 1  0 - 5 %   Eosinophils Absolute 0.1  0.0 - 0.7 K/uL   Basophils Relative 0  0 - 1 %   Basophils Absolute 0.0  0.0 - 0.1 K/uL  COMPREHENSIVE METABOLIC PANEL     Status: Abnormal   Collection Time    10/13/12 11:54 PM      Result Value Range   Sodium 138  135 - 145 mEq/L   Potassium 4.4  3.5 - 5.1 mEq/L   Chloride 103  96 - 112 mEq/L   CO2 30  19 - 32 mEq/L   Glucose, Bld 109 (*) 70 - 99 mg/dL   BUN 10  6 - 23 mg/dL   Creatinine, Ser 7.82  0.50 - 1.35 mg/dL  Calcium 9.4  8.4 - 10.5 mg/dL   Total Protein 7.1  6.0 - 8.3 g/dL   Albumin 3.0 (*) 3.5 - 5.2 g/dL   AST 15  0 - 37 U/L   ALT 11  0 - 53 U/L   Alkaline Phosphatase 71  39 - 117 U/L   Total Bilirubin 0.7  0.3 - 1.2 mg/dL   GFR calc non Af Amer >90  >90 mL/min   GFR calc Af Amer >90  >90 mL/min   Comment:            The eGFR has been calculated     using the CKD EPI equation.     This calculation has not been     validated in all clinical     situations.     eGFR's persistently     <90 mL/min signify     possible Chronic Kidney Disease.   Ct Pelvis W Contrast  10/14/2012   *RADIOLOGY REPORT*  Clinical Data:  Hemorrhoids.  Pain and swelling in the rectum.  CT PELVIS WITH CONTRAST  Technique:  Multidetector CT imaging of the pelvis was performed using the standard protocol following the bolus administration of intravenous contrast.  Contrast: OMNIPAQUE IOHEXOL 300 MG/ML  SOLN  Comparison:   None.  Findings:  The dominant abnormality of the fluid collection which appears intramural in the rectum.  The there is no extension to the skin surface identified.  On axial imaging, this measures 23 mm x 17 mm.  Craniocaudal extent is 43 mm.  This appears to be in the posterior  rectal wall.  Inflammatory changes of the distal sigmoid and proximal rectum are present.  Inflammatory changes of the more superior rectum are present with stranding in the pararectal fat. There is no colonic obstruction.  Visualized ascending and descending colon appear normal.  Small bowel appears normal. Vasculature demonstrates atherosclerosis without aneurysm or acute abnormality.  Probable right varicocele.  Visualized portions of the kidneys appear within normal limits.  No aggressive osseous lesions.  Moderate bilateral hip osteoarthritis.  IMPRESSION:  1.  Fluid collection in the inferior rectal wall most compatible with abscess.  No definite extension to the perineum. 2.  Proctitis.   Original Report Authenticated By: Andreas Newport, M.D.    Review of Systems  Constitutional: Negative for weight loss.  HENT: Negative for hearing loss, ear pain, neck pain, tinnitus and ear discharge.   Eyes: Negative for blurred vision, double vision, photophobia and pain.  Respiratory: Negative for cough, sputum production and shortness of breath.   Cardiovascular: Negative for chest pain.  Gastrointestinal: Negative for nausea, vomiting and abdominal pain.       Rectal pain   Genitourinary: Negative for dysuria, urgency, frequency and flank pain.  Musculoskeletal: Negative for myalgias, back pain, joint pain and falls.  Neurological: Negative for dizziness, tingling, sensory change, focal weakness, loss of consciousness and headaches.  Endo/Heme/Allergies: Does not bruise/bleed easily.  Psychiatric/Behavioral: Negative for depression, memory loss and substance abuse. The patient is not nervous/anxious.     Blood pressure 137/78, pulse 77, temperature 98.6 F (37 C), temperature source Oral, resp. rate 16, SpO2 100.00%. Physical Exam  WDWN in NAD HEENT:  EOMI, sclera anicteric Neck:  No masses, no thyromegaly Lungs:  CTA bilaterally; normal respiratory effort CV:  Regular rate and rhythm; no  murmurs Abd:  +bowel sounds, soft, non-tender, no masses Rectal:  No pain when on his side; tender fluctuance in left posterior perirectal region; very tender  on digital rectal examination posteriorly - firm fluctuance in wall of rectum Ext:  Well-perfused; no edema Skin:  Warm, dry; no sign of jaundice   Assessment/Plan Posterior perirectal/ intramural abscess  Admit for IV antibiotics Keep NPO To OR later today for examination under anesthesia and drainage of abscess.  Cecil Bixby K. 10/14/2012, 2:36 AM

## 2012-10-14 NOTE — ED Notes (Addendum)
Pt completely undressed, into gown, denies pain sob or nausea, VSS, family at [facility], pt alert, NAD, calm, interactive. Unable to give urine sample.

## 2012-10-14 NOTE — ED Notes (Signed)
Last PO intake 1500.

## 2012-10-14 NOTE — ED Provider Notes (Addendum)
52 year old male comes in with three-day history of rectal pain which is worse after a bowel movement. CT head showed an a rectal wall abscess. On exam, there is tenderness induration more prominent to the left of the anus. There is no external fluctuance. Because of proximity to the rectal wall, this will need to be drained by general surgery. Case is discussed with Dr.Tsui who agrees to come and evaluate the patient.   Dione Booze, MD 10/14/12 [phone]  Medical screening examination/treatment/procedure(s) were conducted as a shared visit with non-physician practitioner(s) and myself.  I personally evaluated the patient during the encounter   Dione Booze, MD 10/14/12 [phone]

## 2012-10-14 NOTE — Anesthesia Preprocedure Evaluation (Addendum)
Anesthesia Evaluation  Patient identified by MRN, date of birth, ID band Patient awake    Reviewed: Allergy & Precautions, H&P , NPO status , Patient's Chart, lab work & pertinent test results  Airway Mallampati: II TM Distance: >3 FB Neck ROM: Full    Dental no notable dental hx. (+) Edentulous Upper, Edentulous Lower and Dental Advisory Given   Pulmonary Current Smoker,  breath sounds clear to auscultation  Pulmonary exam normal       Cardiovascular hypertension, Pt. on medications Rhythm:Regular Rate:Normal     Neuro/Psych negative neurological ROS  negative psych ROS   GI/Hepatic negative GI ROS, Neg liver ROS, Perirectal abscess    Endo/Other  negative endocrine ROS  Renal/GU negative Renal ROS  negative genitourinary   Musculoskeletal  (+) Arthritis -, Osteoarthritis,    Abdominal   Peds  Hematology negative hematology ROS (+)   Anesthesia Other Findings   Reproductive/Obstetrics negative OB ROS                        Anesthesia Physical Anesthesia Plan  ASA: II  Anesthesia Plan: General   Post-op Pain Management:    Induction: Intravenous  Airway Management Planned: LMA and Oral ETT  Additional Equipment:   Intra-op Plan:   Post-operative Plan: Extubation in OR  Informed Consent: I have reviewed the patients History and Physical, chart, labs and discussed the procedure including the risks, benefits and alternatives for the proposed anesthesia with the patient or authorized representative who has indicated his/her understanding and acceptance.   Dental advisory given  Plan Discussed with: CRNA  Anesthesia Plan Comments:         Anesthesia Quick Evaluation

## 2012-10-15 ENCOUNTER — Encounter (HOSPITAL_COMMUNITY): Payer: Self-pay | Admitting: General Surgery

## 2012-10-15 MED ORDER — PANTOPRAZOLE SODIUM 40 MG PO TBEC
40.0000 mg | DELAYED_RELEASE_TABLET | Freq: Every day | ORAL | Status: DC
  Administered 2012-10-15: 40 mg via ORAL
  Filled 2012-10-15: qty 1

## 2012-10-15 NOTE — Progress Notes (Signed)
1 Day Post-Op  Subjective: Pt feels better.  Pain under good control.  Has not been out of bed.  Foley.  +flatus, no bm.  Objective: Vital signs in last 24 hours: Temp:  [98.1 F (36.7 C)-103 F (39.4 C)] 98.1 F (36.7 C) (07/29 0517) Pulse Rate:  [73-125] 74 (07/29 0517) Resp:  [17-33] 18 (07/29 0517) BP: (108-202)/(62-100) 110/62 mmHg (07/29 0517) SpO2:  [90 %-100 %] 99 % (07/29 0517) Weight:  [188 lb (85.276 kg)] 188 lb (85.276 kg) (07/28 1734) Last BM Date: 10/13/12  Intake/Output from previous day: 07/28 0701 - 07/29 0700 In: 3485 [P.O.:480; I.V.:2405; IV Piggyback:600] Out: 1650 [Urine:1600; Blood:50] Intake/Output this shift:    General appearance: alert, cooperative, appears stated age and no distress Resp: clear to auscultation bilaterally Cardio: regular rate and rhythm, S1, S2 normal, no murmur, click, rub or gallop GI: soft, non-tender; bowel sounds normal; no masses,  no organomegaly Extremities: extremities normal, atraumatic, no cyanosis or edema  Lab Results:   Recent Labs  10/13/12 2354  WBC 9.4  HGB 17.2*  HCT 48.2  PLT 162   BMET  Recent Labs  10/13/12 2354  NA 138  K 4.4  CL 103  CO2 30  GLUCOSE 109*  BUN 10  CREATININE 0.99  CALCIUM 9.4   PT/INR No results found for this basename: LABPROT, INR,  in the last 72 hours ABG No results found for this basename: PHART, PCO2, PO2, HCO3,  in the last 72 hours  Studies/Results: Ct Pelvis W Contrast  10/14/2012   *RADIOLOGY REPORT*  Clinical Data:  Hemorrhoids.  Pain and swelling in the rectum.  CT PELVIS WITH CONTRAST  Technique:  Multidetector CT imaging of the pelvis was performed using the standard protocol following the bolus administration of intravenous contrast.  Contrast: OMNIPAQUE IOHEXOL 300 MG/ML  SOLN  Comparison:   None.  Findings:  The dominant abnormality of the fluid collection which appears intramural in the rectum.  The there is no extension to the skin surface  identified.  On axial imaging, this measures 23 mm x 17 mm.  Craniocaudal extent is 43 mm.  This appears to be in the posterior rectal wall.  Inflammatory changes of the distal sigmoid and proximal rectum are present.  Inflammatory changes of the more superior rectum are present with stranding in the pararectal fat. There is no colonic obstruction.  Visualized ascending and descending colon appear normal.  Small bowel appears normal. Vasculature demonstrates atherosclerosis without aneurysm or acute abnormality.  Probable right varicocele.  Visualized portions of the kidneys appear within normal limits.  No aggressive osseous lesions.  Moderate bilateral hip osteoarthritis.  IMPRESSION:  1.  Fluid collection in the inferior rectal wall most compatible with abscess.  No definite extension to the perineum. 2.  Proctitis.   Original Report Authenticated By: Andreas Newport, M.D.    Anti-infectives: Anti-infectives   Start     Dose/Rate Route Frequency Ordered Stop   10/14/12 0600  metroNIDAZOLE (FLAGYL) IVPB 500 mg     500 mg 100 mL/hr over 60 Minutes Intravenous Every 8 hours 10/14/12 0439     10/14/12 0500  ciprofloxacin (CIPRO) IVPB 400 mg     400 mg 200 mL/hr over 60 Minutes Intravenous Every 12 hours 10/14/12 0439        Assessment/Plan: Perirectal abscess -s/p I&D 7/28 Dr. Janee Morn -afebrile, normal white count -change dressing today, premedicate with iv pain meds -continue with atbx -ambulate -DC foley -regular diet   LOS:  2 days    Ashok Norris ANP-BC Pager [phone]  10/15/2012 9:28 AM

## 2012-10-15 NOTE — Progress Notes (Signed)
See if can tolerate dressing change with orals. Patient examined and I agree with the assessment and plan  Violeta Gelinas, MD, MPH, FACS Pager: [phone]  10/15/2012 3:34 PM

## 2012-10-15 NOTE — Care Management Note (Signed)
  Page 1 of 1   10/15/2012     3:59:25 PM   CARE MANAGEMENT NOTE 10/15/2012  Patient:  MILON, DETHLOFF   Account Number:  [id number]  Date Initiated:  10/15/2012  Documentation initiated by:  Ronny Flurry  Subjective/Objective Assessment:     Action/Plan:   Anticipated DC Date:  10/15/2012   Anticipated DC Plan:  HOME W HOME HEALTH SERVICES         Choice offered to / List presented to:          [facility] arranged  HH-1 RN      [facility] agency  [facility]   Status of service:  In process, will continue to follow Medicare Important Message given?   (If response is "NO", the following Medicare IM given date fields will be blank) Date Medicare IM given:   Date Additional Medicare IM given:    Discharge Disposition:  HOME W HOME HEALTH SERVICES  Per UR Regulation:    If discussed at Long Length of Stay Meetings, dates discussed:    Comments:   10-15-12 Confirmed face sheet information with patient and his wife . Cell phone number is incorrect . Current cell phone is [phone]. Wife is willing to be shown how to do the dressing changes.  Patient needs PCP . Received permission from patient to email [facility] for follow appointment .  Ronny Flurry RN BSN [phone]

## 2012-10-15 NOTE — Progress Notes (Signed)
Patient's dressing re-inforced to rectal abscess site. Foley removed. Will monitor output and comfort. Plans for going home tomorrow. Eric Nielsen

## 2012-10-16 MED ORDER — METRONIDAZOLE 500 MG PO TABS: 500.0000 mg | ORAL_TABLET | Freq: Three times a day (TID) | ORAL | Status: DC

## 2012-10-16 MED ORDER — CIPROFLOXACIN HCL 500 MG PO TABS: 500.0000 mg | ORAL_TABLET | Freq: Two times a day (BID) | ORAL | Status: DC

## 2012-10-16 MED ORDER — OXYCODONE-ACETAMINOPHEN 5-325 MG PO TABS: 1.0000 | ORAL_TABLET | Freq: Four times a day (QID) | ORAL | Status: DC | PRN

## 2012-10-16 MED ORDER — POLYETHYLENE GLYCOL 3350 17 GM/SCOOP PO POWD: 8.5000 g | Freq: Every day | ORAL | Status: AC

## 2012-10-16 NOTE — Discharge Summary (Signed)
Patient examined and I agree with the assessment and plan  Violeta Gelinas, MD, MPH, FACS Pager: [phone]  10/16/2012 1:11 PM

## 2012-10-16 NOTE — Discharge Summary (Signed)
Physician Discharge Summary  Patient ID: Eric Nielsen MRN: [id number] DOB/AGE: 50/08/1960 50 y.o.  Admit date: 10/13/2012 Discharge date: 10/16/2012  Admitting Diagnosis: Perirectal abscess  Discharge Diagnosis Patient Active Problem List   Diagnosis Date Noted  . Perirectal abscess 10/14/2012    Consultants None  Procedures Incision and drainage of perirectal abscess(Dr. Corliss Skains 10/14/12)  Hospital Course:  Eric Nielsen is a healthy male who presented to [facility] with worsening rectal pain.  Workup showed a perirectal abscess.  Patient was admitted and underwent procedure listed above.  Tolerated procedure well and was transferred to the floor.  Diet was advanced as tolerated.  On POD #1 his dressing was changed, he still had quite a bit of pain and required iv pain medication.  He was transitioned to oral pain medication for dressing changes.  The patient was voiding well, tolerating diet, ambulating well, pain well controlled, vital signs stable, incisions c/d/i and felt stable for discharge home.  Patient will follow up in our office in 2-3 weeks and knows to call with questions or concerns.  He will continue with atbx for 10 more days, case manager provided voucher.  Culture were obtained, did not show any growth to date.    Physical Exam: General:  Alert, NAD, pleasant, comfortable Abd:  Soft, round and non distended.  Perirectal dressing is dry and intact.     Medication List    ASK your doctor about these medications       amLODipine-valsartan 10-160 MG per tablet  Commonly known as:  EXFORGE  Take 1 tablet by mouth daily.     hydrocortisone cream 1 %  Apply 1 application topically 2 (two) times daily.     ibuprofen 200 MG tablet  Commonly known as:  ADVIL,MOTRIN  Take 200 mg by mouth every 6 (six) hours as needed for pain.         Follow-up Information   Follow up with Wynona Luna., MD On 11/11/2012. (APPOINTMENT 2:30PM.  )    Contact information:   [address] [place] [address] [phone]       Signed: Ashok Norris, [facility] [phone]  10/16/2012, 8:52 AM

## 2012-10-16 NOTE — Progress Notes (Signed)
[employer] letter for medication assistance given and explained to patient and his wife. Both voiced understanding . Ronny Flurry RN BSN [phone]

## 2012-10-16 NOTE — Progress Notes (Signed)
Patient discharged with family at 11:15am. Case manager consulted for assistance medications. Discharge instructions provided with voiced understanding.

## 2012-10-17 LAB — CULTURE, ROUTINE-ABSCESS

## 2012-10-19 LAB — ANAEROBIC CULTURE

## 2012-10-25 ENCOUNTER — Inpatient Hospital Stay: Payer: Self-pay

## 2012-10-30 ENCOUNTER — Encounter (INDEPENDENT_AMBULATORY_CARE_PROVIDER_SITE_OTHER): Payer: Self-pay | Admitting: General Surgery

## 2012-10-30 ENCOUNTER — Ambulatory Visit (INDEPENDENT_AMBULATORY_CARE_PROVIDER_SITE_OTHER): Payer: Self-pay | Admitting: General Surgery

## 2012-10-30 VITALS — BP 134/78 | HR 72 | Temp 98.2°F | Resp 14 | Ht 72.0 in | Wt 181.6 lb

## 2012-10-30 DIAGNOSIS — K612 Anorectal abscess: Secondary | ICD-10-CM

## 2012-10-30 DIAGNOSIS — K611 Rectal abscess: Secondary | ICD-10-CM

## 2012-10-30 NOTE — Progress Notes (Signed)
Subjective:     Patient ID: Eric Nielsen, male   DOB: [birth year], 52 y.o.   MRN: [id number]  HPI Patient is status post incision and drainage of perirectal abscess. He is doing much better. He completed his antibiotics. He is no longer having any difficulty passing his urine or passing his bowels. This had been a problem for some time. He is very satisfied and feeling back to normal. His wife has been doing dressing changes.  Review of Systems     Objective:   Physical Exam Wound is very shallow with clean granulation tissue. Small amount of packing was removed. No evidence of ongoing infection. Plain gauze dressing was applied.    Assessment:     Doing very well status post incision and drainage of perirectal abscess    Plan:     Return as needed.  dry gauze until healed. No further packing is required.

## 2012-10-30 NOTE — Patient Instructions (Signed)
Cover With a plain bandage until healed. No further packing is necessary.

## 2012-11-11 ENCOUNTER — Encounter (INDEPENDENT_AMBULATORY_CARE_PROVIDER_SITE_OTHER): Payer: Self-pay | Admitting: Surgery

## 2012-11-15 ENCOUNTER — Telehealth: Payer: Self-pay | Admitting: Family Medicine

## 2012-12-04 ENCOUNTER — Encounter (INDEPENDENT_AMBULATORY_CARE_PROVIDER_SITE_OTHER): Payer: Self-pay | Admitting: General Surgery

## 2012-12-13 NOTE — Telephone Encounter (Signed)
Called pt, left message.

## 2013-04-18 ENCOUNTER — Encounter (HOSPITAL_COMMUNITY): Payer: Self-pay | Admitting: Emergency Medicine

## 2013-04-18 ENCOUNTER — Encounter (HOSPITAL_COMMUNITY)
Admission: EM | Disposition: A | Discharge status: Home / Self Care | Payer: Self-pay | Source: Home / Self Care | Attending: Emergency Medicine

## 2013-04-18 ENCOUNTER — Ambulatory Visit (HOSPITAL_COMMUNITY): Admit: 2013-04-18 | Payer: Self-pay | Admitting: Interventional Cardiology

## 2013-04-18 ENCOUNTER — Emergency Department (HOSPITAL_COMMUNITY)
Admission: EM | Admit: 2013-04-18 | Discharge: 2013-04-18 | Disposition: A | Discharge status: Home / Self Care | Payer: Self-pay | Attending: Emergency Medicine | Admitting: Emergency Medicine

## 2013-04-18 ENCOUNTER — Emergency Department (HOSPITAL_COMMUNITY): Payer: Self-pay

## 2013-04-18 DIAGNOSIS — I219 Acute myocardial infarction, unspecified: Secondary | ICD-10-CM

## 2013-04-18 DIAGNOSIS — F17200 Nicotine dependence, unspecified, uncomplicated: Secondary | ICD-10-CM | POA: Insufficient documentation

## 2013-04-18 DIAGNOSIS — I359 Nonrheumatic aortic valve disorder, unspecified: Secondary | ICD-10-CM

## 2013-04-18 DIAGNOSIS — R55 Syncope and collapse: Secondary | ICD-10-CM

## 2013-04-18 DIAGNOSIS — R5383 Other fatigue: Secondary | ICD-10-CM

## 2013-04-18 DIAGNOSIS — R9431 Abnormal electrocardiogram [ECG] [EKG]: Secondary | ICD-10-CM

## 2013-04-18 DIAGNOSIS — I10 Essential (primary) hypertension: Secondary | ICD-10-CM | POA: Insufficient documentation

## 2013-04-18 DIAGNOSIS — R5381 Other malaise: Secondary | ICD-10-CM | POA: Insufficient documentation

## 2013-04-18 DIAGNOSIS — Z8739 Personal history of other diseases of the musculoskeletal system and connective tissue: Secondary | ICD-10-CM | POA: Insufficient documentation

## 2013-04-18 DIAGNOSIS — Z79899 Other long term (current) drug therapy: Secondary | ICD-10-CM | POA: Insufficient documentation

## 2013-04-18 DIAGNOSIS — R0789 Other chest pain: Secondary | ICD-10-CM | POA: Insufficient documentation

## 2013-04-18 DIAGNOSIS — R61 Generalized hyperhidrosis: Secondary | ICD-10-CM | POA: Insufficient documentation

## 2013-04-18 DIAGNOSIS — R110 Nausea: Secondary | ICD-10-CM | POA: Insufficient documentation

## 2013-04-18 DIAGNOSIS — R0602 Shortness of breath: Secondary | ICD-10-CM | POA: Insufficient documentation

## 2013-04-18 LAB — POCT I-STAT TROPONIN I: TROPONIN I, POC: 0.01 ng/mL (ref 0.00–0.08)

## 2013-04-18 LAB — COMPREHENSIVE METABOLIC PANEL
ALBUMIN: 4.4 g/dL (ref 3.5–5.2)
ALT: 28 U/L (ref 0–53)
AST: 32 U/L (ref 0–37)
Alkaline Phosphatase: 99 U/L (ref 39–117)
BUN: 15 mg/dL (ref 6–23)
CALCIUM: 9.8 mg/dL (ref 8.4–10.5)
CO2: 26 meq/L (ref 19–32)
CREATININE: 1.24 mg/dL (ref 0.50–1.35)
Chloride: 92 mEq/L — ABNORMAL LOW (ref 96–112)
GFR calc Af Amer: 77 mL/min — ABNORMAL LOW (ref >90)
GFR, EST NON AFRICAN AMERICAN: 66 mL/min — AB (ref >90)
Glucose, Bld: 108 mg/dL — ABNORMAL HIGH (ref 70–99)
Potassium: 3.8 mEq/L (ref 3.7–5.3)
SODIUM: 138 meq/L (ref 137–147)
TOTAL PROTEIN: 9.4 g/dL — AB (ref 6.0–8.3)
Total Bilirubin: 0.6 mg/dL (ref 0.3–1.2)

## 2013-04-18 LAB — CBC
HCT: 54.3 % — ABNORMAL HIGH (ref 39.0–52.0)
Hemoglobin: 20 g/dL — ABNORMAL HIGH (ref 13.0–17.0)
MCH: 34.4 pg — ABNORMAL HIGH (ref 26.0–34.0)
MCHC: 36.8 g/dL — ABNORMAL HIGH (ref 30.0–36.0)
MCV: 93.3 fL (ref 78.0–100.0)
Platelets: 161 K/uL (ref 150–400)
RBC: 5.82 MIL/uL — ABNORMAL HIGH (ref 4.22–5.81)
RDW: 13.3 % (ref 11.5–15.5)
WBC: 7.9 K/uL (ref 4.0–10.5)

## 2013-04-18 LAB — POCT I-STAT, CHEM 8
BUN: 16 mg/dL (ref 6–23)
CHLORIDE: 94 meq/L — AB (ref 96–112)
Calcium, Ion: 1.18 mmol/L (ref 1.12–1.23)
Creatinine, Ser: 1.4 mg/dL — ABNORMAL HIGH (ref 0.50–1.35)
Glucose, Bld: 111 mg/dL — ABNORMAL HIGH (ref 70–99)
HCT: 60 % — ABNORMAL HIGH (ref 39.0–52.0)
Hemoglobin: 20.4 g/dL — ABNORMAL HIGH (ref 13.0–17.0)
Potassium: 3.4 mEq/L — ABNORMAL LOW (ref 3.7–5.3)
SODIUM: 140 meq/L (ref 137–147)
TCO2: 28 mmol/L (ref 0–100)

## 2013-04-18 LAB — PROTIME-INR
INR: 1.01 (ref 0.00–1.49)
Prothrombin Time: 13.1 s (ref 11.6–15.2)

## 2013-04-18 LAB — TROPONIN I: Troponin I: <0.3 ng/mL (ref <0.30)

## 2013-04-18 LAB — D-DIMER, QUANTITATIVE: D-Dimer, Quant: 0.48 ug{FEU}/mL (ref 0.00–0.48)

## 2013-04-18 LAB — APTT: aPTT: 32 seconds (ref 24–37)

## 2013-04-18 SURGERY — LEFT HEART CATHETERIZATION WITH CORONARY ANGIOGRAM
Anesthesia: LOCAL

## 2013-04-18 MED ORDER — SODIUM CHLORIDE 0.9 % IV SOLN: INTRAVENOUS | Status: DC

## 2013-04-18 MED ORDER — HEPARIN SODIUM (PORCINE) 5000 UNIT/ML IJ SOLN: 60.0000 [IU]/kg | INTRAMUSCULAR | Status: DC

## 2013-04-18 MED ORDER — SODIUM CHLORIDE 0.9 % IV BOLUS (SEPSIS)
1000.0000 mL | Freq: Once | INTRAVENOUS | Status: AC
  Administered 2013-04-18: 1000 mL via INTRAVENOUS

## 2013-04-18 MED ORDER — ASPIRIN 81 MG PO CHEW: 324.0000 mg | CHEWABLE_TABLET | Freq: Once | ORAL | Status: DC

## 2013-04-18 NOTE — ED Notes (Signed)
Main lab called to add D-dimer to blood already received

## 2013-04-18 NOTE — ED Notes (Signed)
Pt remains pain free, denies SOB, n/v, diaphoresis. Reports prior to near syncopal episode he felt flushed, hot, sweaty, dizzy, lightheaded, nauseated. States vomited prior to EMS arrival. Denies diarrhea.  States had a brief 5 second episode of left side CP enroute to ED, described pain as pressure. Presently denies dizziness, nausea. Only complaint is feeling tired.

## 2013-04-18 NOTE — ED Notes (Signed)
Dr. Chesterfield at bedside.

## 2013-04-18 NOTE — ED Provider Notes (Signed)
7:00 PM Patient is chest pain-free.  His echocardiogram was without wall motion abnormalities.  Troponin x2 negative.  Unlikely to be ACS.  Discharge home in good condition.  Please see cardiology consult note   Hoy Morn, MD 04/18/13 1900

## 2013-04-18 NOTE — ED Notes (Signed)
Report received, assumed care.  

## 2013-04-18 NOTE — Consult Note (Addendum)
  Interventional Cardiology Consult   REASON: STEMI activation   Subjective: The patient is a 53 year old African American male who while eating lunch consisting of "a greasy hamburger" he became nauseated, sweaty, and his girlfriend noted that his eyes rolled back and he fainted. He had no chest pain complaints prior to this episode. EMS was summoned. Upon awakening he had 5 seconds of the left upper parasternal discomfort that was 10 point in location. He is totally pain-free at this time. No nausea or dyspnea is present. He is nervous and tearful. His prior history of fainting on at least one other occasion.  There is a history of hypertension. He does not smoke, denies cocaine and illicit drug use, and is not diabetic. He denies a family history of premature atherosclerosis.  Objective: Blood pressure 121/75 heart rate 92, the patient is in no acute distress and his respiratory rate is 16 HEENT exam reveals the vagina is pallor Neck exam reveals no JVD. No carotid bruits. Chest is clear to auscultation and percussion. No rales or wheezing is heard. There is no palpable chest wall tenderness. Cardiac exam is unremarkable. A pericardial friction rub is not appreciated. The abdomen is unremarkable. There is no tenderness. The extremities reveal no edema. Pedal and radial pulses are symmetric bilaterally. Neurologically the patient is intact. He is alert and oriented. He is anxious and tearful.  ECG: From 2005 demonstrates precordial ST elevation with peak T waves have a slightly slower heart rate. Today's ECG on presentation reveals precordial ST elevation V2 through 4 without reciprocal change ST elevation is more prominent than the tracing from 2005.  Impression: 1. Syncope of uncertain etiology, but with features consistent with vasovagal syncope/neurally mediated syncope. Rule out ventricular tachyarrhythmia versus bradycardia asystole 2. EKG with ST elevation more prominent than on a prior  tracing 10 years ago. I suspect that the ST segment changes are related to early repolarization in absence of any cardiac symptoms at the time of evaluation the  Recommendations: Serial EKG tracings Cycle cardiac markers Call us if chest pain consistent with angina STEMI activation was canceled based upon my clinical belief that the absence of symptoms and a previously documented abnormal/similar EKG rules out acute MI .  Time: 1062 through 1510  The patient was seen at the bedside in the emergency room and continuously managed for the above timeframe until we made our final clinical assessment.    ADDENDUM: 5:50 4 PM The 2-D echocardiogram demonstrates no evidence of anterior wall motion appear. Cardiac markers unremarkable.

## 2013-04-18 NOTE — ED Notes (Signed)
EDP at the bedside.  ?

## 2013-04-18 NOTE — ED Notes (Signed)
ECHO at bedside.

## 2013-04-18 NOTE — ED Notes (Signed)
Cardiologist at the bedside

## 2013-04-18 NOTE — ED Provider Notes (Signed)
CSN: [id number]     Arrival date & time 04/18/13  1438 History   First MD Initiated Contact with Patient 04/18/13 1445     Chief Complaint  Patient presents with  . Code STEMI   (Consider location/radiation/quality/duration/timing/severity/associated sxs/prior Treatment) HPI Comments: Patient was at home eating a cheeseburger when he began to feel hot, sweaty and lightheaded. His girlfriend called EMS. On EMS arrival EKG showed ST segment elevation in anterior lateral leads. He was not having any chest pain or SOB at the time.  On the way to being transported to the hospital, patient of sharp stabbing chest pain in his left chest that lasted for about 5 seconds and did not radiate. The pain is now resolved. STEMI was then called. On arrival to the ED patient is pain-free and denies complaint. He says he feels better. He denies any dizziness, lightheadedness, chest pain, shortness of breath, nausea or vomiting. He states he has a medical history of hypertension. He is a smoker. No previous heart disease. Review of his EKG shows similar to previous in 2005 and code STEMI was canceled.  The history is provided by the patient and the EMS personnel. The history is limited by the condition of the patient.    Past Medical History  Diagnosis Date  . Arthritis   . HTN (hypertension)    Past Surgical History  Procedure Laterality Date  . Incision and drainage perirectal abscess N/A 10/14/2012    Procedure: IRRIGATION AND DEBRIDEMENT PERIRECTAL ABSCESS;  Surgeon: Zenovia Jarred, MD;  Location: [facility];  Service: General;  Laterality: N/A;   Family History  Problem Relation Age of Onset  . Hypertension Mother   . Cancer Mother     Lung and breast and brain  . Heart failure Father   . Hypertension Father    History  Substance Use Topics  . Smoking status: Current Every Day Smoker -- 1.00 packs/day  . Smokeless tobacco: Never Used  . Alcohol Use: Yes    Review of Systems  Constitutional:  Positive for diaphoresis. Negative for fever, activity change and appetite change.  Respiratory: Positive for chest tightness and shortness of breath.   Cardiovascular: Positive for chest pain.  Gastrointestinal: Positive for nausea. Negative for vomiting and abdominal pain.  Genitourinary: Negative for dysuria, urgency and hematuria.  Musculoskeletal: Negative for arthralgias, back pain and myalgias.  Skin: Negative for rash.  Neurological: Positive for weakness. Negative for dizziness, light-headedness and headaches.  A complete 10 system review of systems was obtained and all systems are negative except as noted in the HPI and PMH.    Allergies  Review of patient's allergies indicates no known allergies.  Home Medications   Current Outpatient Rx  Name  Route  Sig  Dispense  Refill  . lisinopril (PRINIVIL,ZESTRIL) 40 MG tablet   Oral   Take 40 mg by mouth daily.          BP 138/75  Pulse 98  Temp(Src) 99.5 F (37.5 C)  Resp 19  Ht 5\' 8"  (1.727 m)  Wt 181 lb (82.101 kg)  BMI 27.53 kg/m2  SpO2 95% Physical Exam  Constitutional: He is oriented to person, place, and time. He appears well-developed and well-nourished. No distress.  HENT:  Head: Normocephalic and atraumatic.  Mouth/Throat: Oropharynx is clear and moist. No oropharyngeal exudate.  Eyes: Conjunctivae and EOM are normal. Pupils are equal, round, and reactive to light.  Neck: Normal range of motion. Neck supple.  Cardiovascular: Normal rate, regular  rhythm and normal heart sounds.   No murmur heard. Pulmonary/Chest: Effort normal and breath sounds normal. No respiratory distress. He has no wheezes.  Abdominal: Soft. There is no tenderness. There is no rebound and no guarding.  Musculoskeletal: He exhibits no edema and no tenderness.  Neurological: He is alert and oriented to person, place, and time. No cranial nerve deficit. He exhibits normal muscle tone. Coordination normal.  CN 2-12 intact, no ataxia on  finger to nose, no nystagmus, 5/5 strength throughout, no pronator drift, Romberg negative, normal gait.   Skin: Skin is warm.    ED Course  Procedures (including critical care time) Labs Review Labs Reviewed  CBC - Abnormal; Notable for the following:    RBC 5.82 (*)    Hemoglobin 20.0 (*)    HCT 54.3 (*)    MCH 34.4 (*)    MCHC 36.8 (*)    All other components within normal limits  COMPREHENSIVE METABOLIC PANEL - Abnormal; Notable for the following:    Chloride 92 (*)    Glucose, Bld 108 (*)    Total Protein 9.4 (*)    GFR calc non Af Amer 66 (*)    GFR calc Af Amer 77 (*)    All other components within normal limits  POCT I-STAT, CHEM 8 - Abnormal; Notable for the following:    Potassium 3.4 (*)    Chloride 94 (*)    Creatinine, Ser 1.40 (*)    Glucose, Bld 111 (*)    Hemoglobin 20.4 (*)    HCT 60.0 (*)    All other components within normal limits  APTT  PROTIME-INR  D-DIMER, QUANTITATIVE  TROPONIN I  POCT I-STAT TROPONIN I   Imaging Review Dg Chest Port 1 View  04/18/2013   CLINICAL DATA:  "Code  STEMI "  EXAM: PORTABLE CHEST - 1 VIEW  COMPARISON:  None.  FINDINGS: The heart size and mediastinal contours are within normal limits. Both lungs are clear. The visualized skeletal structures are unremarkable.  IMPRESSION: No active disease.   Electronically Signed   By: Lahoma Crocker M.D.   On: 04/18/2013 15:01    EKG Interpretation    Date/Time:  Friday April 18 2013 14:42:07 EST Ventricular Rate:  93 PR Interval:  169 QRS Duration: 90 QT Interval:  371 QTC Calculation: 461 R Axis:   -72 Text Interpretation:  Sinus rhythm LAE, consider biatrial enlargement Left anterior fascicular block Lateral leads are also involved Confirmed by Wyvonnia Dusky  MD, Jaivon Vanbeek ([id number]) on 04/18/2013 3:02:48 PM            MDM   1. Syncope   2. Abnormal EKG   3. Near syncope   4. EKG abnormality    Near syncopal episode followed by a brief episode of chest pain now resolved.  Fleeting seconds of chest pain is not consistent with ACS. Patient seen at bedside with Dr. Tamala Julian on arrival. He is feeling better. Code STEMI cancelled. EKG is similar to previous.  Orthostatics positive by HR.  Troponin negative Cr 1.4, Hb 20, likely some element of hemoconcentration and dehydration.  Patient given IVF.  He remains asymptomatic and denies SOB.  CXR and d-dimer negative.  Serial troponins will be obtained on recommendation of Dr. Tamala Julian. Echo pending. Care signed out to Dr. Venora Maples at shift change.  Ezequiel Essex, MD 04/18/13 [phone]

## 2013-04-18 NOTE — ED Notes (Signed)
Pt to department via EMS- pt reports he has a witnessed syncopal episode at home. EMS reports that he started having chest pain in the truck. Pt denies any pain on arrival. 324 ASA 18 left forearm. Bp-110/70 Hr-104 CBG-107

## 2013-04-18 NOTE — ED Notes (Signed)
Pt eating food brought by family ? ?

## 2013-04-18 NOTE — Progress Notes (Signed)
  Echocardiogram 2D Echocardiogram has been performed.  Eric Nielsen 04/18/2013, 5:20 PM

## 2013-11-27 ENCOUNTER — Ambulatory Visit: Payer: Self-pay | Admitting: Family Medicine

## 2013-12-24 ENCOUNTER — Encounter: Payer: Self-pay | Admitting: Family Medicine

## 2013-12-24 ENCOUNTER — Ambulatory Visit (INDEPENDENT_AMBULATORY_CARE_PROVIDER_SITE_OTHER): Payer: Self-pay | Admitting: Family Medicine

## 2013-12-24 VITALS — BP 163/71 | HR 85 | Temp 99.0°F | Ht 69.0 in | Wt 200.4 lb

## 2013-12-24 DIAGNOSIS — Z1159 Encounter for screening for other viral diseases: Secondary | ICD-10-CM

## 2013-12-24 DIAGNOSIS — I10 Essential (primary) hypertension: Secondary | ICD-10-CM | POA: Insufficient documentation

## 2013-12-24 DIAGNOSIS — R739 Hyperglycemia, unspecified: Secondary | ICD-10-CM

## 2013-12-24 DIAGNOSIS — Z1211 Encounter for screening for malignant neoplasm of colon: Secondary | ICD-10-CM | POA: Insufficient documentation

## 2013-12-24 DIAGNOSIS — Z114 Encounter for screening for human immunodeficiency virus [HIV]: Secondary | ICD-10-CM

## 2013-12-24 LAB — CBC
HCT: 46.9 % (ref 39.0–52.0)
Hemoglobin: 16.5 g/dL (ref 13.0–17.0)
MCH: 33 pg (ref 26.0–34.0)
MCHC: 35.2 g/dL (ref 30.0–36.0)
MCV: 93.8 fL (ref 78.0–100.0)
Platelets: 130 10*3/uL — ABNORMAL LOW (ref 150–400)
RBC: 5 MIL/uL (ref 4.22–5.81)
RDW: 13.9 % (ref 11.5–15.5)
WBC: 4.8 10*3/uL (ref 4.0–10.5)

## 2013-12-24 LAB — COMPREHENSIVE METABOLIC PANEL
ALK PHOS: 83 U/L (ref 39–117)
ALT: 21 U/L (ref 0–53)
AST: 24 U/L (ref 0–37)
Albumin: 4.3 g/dL (ref 3.5–5.2)
BUN: 14 mg/dL (ref 6–23)
CALCIUM: 9.8 mg/dL (ref 8.4–10.5)
CO2: 24 mEq/L (ref 19–32)
Chloride: 102 mEq/L (ref 96–112)
Creat: 1.04 mg/dL (ref 0.50–1.35)
Glucose, Bld: 98 mg/dL (ref 70–99)
POTASSIUM: 4 meq/L (ref 3.5–5.3)
Sodium: 137 mEq/L (ref 135–145)
Total Bilirubin: 0.5 mg/dL (ref 0.2–1.2)
Total Protein: 7.6 g/dL (ref 6.0–8.3)

## 2013-12-24 LAB — POCT GLYCOSYLATED HEMOGLOBIN (HGB A1C): HEMOGLOBIN A1C: 5.7

## 2013-12-24 LAB — LIPID PANEL
CHOLESTEROL: 129 mg/dL (ref 0–200)
HDL: 37 mg/dL — ABNORMAL LOW (ref >39)
LDL Cholesterol: 71 mg/dL (ref 0–99)
TRIGLYCERIDES: 107 mg/dL (ref <150)
Total CHOL/HDL Ratio: 3.5 Ratio
VLDL: 21 mg/dL (ref 0–40)

## 2013-12-24 MED ORDER — LISINOPRIL 20 MG PO TABS: 20.0000 mg | ORAL_TABLET | Freq: Every day | ORAL | Status: DC

## 2013-12-24 NOTE — Addendum Note (Signed)
Addended by: [place], Dashanti Burr on: 12/24/2013 03:29 PM   Modules accepted: Orders

## 2013-12-24 NOTE — Progress Notes (Signed)
Eric Nielsen is a 53 y.o. who presents today for new pt evaluation.  Pt is a 53 y/o AA M presenting today for initial evaluation.  Concerns today include BP (has been off lisinopril for about 6 months now) and hip.    BP - Was working well when on lisinopril but has been off due to compliance issues at this time.  Denies ADR from drug including palpitations, cough, edema, CP/SOB.  Denies exercise history or new concerning Sx.  Smokes 1/2-1 ppd and is interesting in quitting.  Not drinking daily per pt.    Hip - Hip pain for 2 yrs now, located R hip, worse with activity, achy pain, has not really tried too much for it.    Past Medical History  Diagnosis Date  . Arthritis   . HTN (hypertension)     History   Social History  . Marital Status: Married    Spouse Name: N/A    Number of Children: N/A  . Years of Education: N/A   Occupational History  . Not on file.   Social History Main Topics  . Smoking status: Current Every Day Smoker -- 1.00 packs/day  . Smokeless tobacco: Never Used  . Alcohol Use: Yes  . Drug Use: No  . Sexual Activity: Yes    Partners: Female   Other Topics Concern  . Not on file   Social History Narrative  . No narrative on file    Family History  Problem Relation Age of Onset  . Hypertension Mother   . Cancer Mother     Lung and breast and brain  . Heart failure Father   . Hypertension Father     Current Outpatient Prescriptions on File Prior to Visit  Medication Sig Dispense Refill  . lisinopril (PRINIVIL,ZESTRIL) 40 MG tablet Take 40 mg by mouth daily.       No current facility-administered medications on file prior to visit.    Patient Information Form: Screening and ROS  AUDIT-C Score: 5 Do you feel safe in relationships? Yes.   PHQ-2:negative  Review of Symptoms  General:  Negative for nexplained weight loss, fever Skin: Negative for new or changing mole, sore that won't heal HEENT: Negative for trouble hearing, trouble seeing,  ringing in ears, mouth sores, hoarseness, change in voice, dysphagia. CV:  Negative for chest pain, dyspnea, edema, palpitations Resp: Negative for cough, dyspnea, hemoptysis GI: Negative for nausea, vomiting, diarrhea, constipation, abdominal pain, melena, hematochezia. GU: Negative for dysuria, incontinence, urinary hesitance, hematuria, vaginal or penile discharge, polyuria, sexual difficulty, lumps in testicle or breasts MSK: Negative for muscle cramps or aches, joint pain or swelling Neuro: Negative for headaches, weakness, numbness, dizziness, passing out/fainting Psych: Negative for depression, anxiety, memory problems  Physical Exam Filed Vitals:   12/24/13 0944  BP: 163/71  Pulse: 85  Temp: 99 F (37.2 C)    Gen: NAD, Well nourished, Well developed HEENT: PERLA, EOMI, [place]/AT Neck: no JVD Cardio: RRR, No murmurs/gallops/rubs Lungs: CTA, no wheezes, rhonchi, crackles Abd: NABS, soft nontender nondistended MSK: ROM normal  Neuro: CN 2-12 intact, MS 5/5 B/L UE and LE, +2 patellar and achilles relfex b/l  Psych: AAO x 3     Chemistry      Component Value Date/Time   NA 140 04/18/2013 1454   K 3.4* 04/18/2013 1454   CL 94* 04/18/2013 1454   CO2 26 04/18/2013 1446   BUN 16 04/18/2013 1454   CREATININE 1.40* 04/18/2013 1454  Component Value Date/Time   CALCIUM 9.8 04/18/2013 1446   ALKPHOS 99 04/18/2013 1446   AST 32 04/18/2013 1446   ALT 28 04/18/2013 1446   BILITOT 0.6 04/18/2013 1446      Lab Results  Component Value Date   WBC 7.9 04/18/2013   HGB 20.4* 04/18/2013   HCT 60.0* 04/18/2013   MCV 93.3 04/18/2013   PLT 161 04/18/2013

## 2013-12-24 NOTE — Assessment & Plan Note (Signed)
Information given for colonoscopy, recommend having done, will continue to follow.  Paper sheet given with information on local practices.  Denies wanting influenza vaccines today.

## 2013-12-24 NOTE — Patient Instructions (Signed)
Mr. Dorantes, it was nice seeing you today.  Please start taking the lisinopril again and we will see you back in 4-6 weeks.  Thanks, Dr. Awanda Mink

## 2013-12-24 NOTE — Assessment & Plan Note (Signed)
BP elevated today to 163/71, not on current medications right now.  Previously on lisinopril w/o ADR.  Will check labs today including CMET (does have Gamma Gap, may need further investigation with SPEP/UPEP/Serum Light Chains), Micro:creatine ratio, CBC, Lipid, A1C.  F/U in 4-6 weeks to repeat BMET and recheck BP.

## 2013-12-25 ENCOUNTER — Encounter: Payer: Self-pay | Admitting: Family Medicine

## 2013-12-25 LAB — MICROALBUMIN / CREATININE URINE RATIO
CREATININE, URINE: 57.6 mg/dL
MICROALB UR: 0.3 mg/dL (ref <2.0)
Microalb Creat Ratio: 5.2 mg/g (ref 0.0–30.0)

## 2013-12-25 LAB — HIV ANTIBODY (ROUTINE TESTING W REFLEX): HIV 1&2 Ab, 4th Generation: NONREACTIVE

## 2013-12-25 LAB — HEPATITIS C ANTIBODY: HCV Ab: NEGATIVE

## 2014-04-09 ENCOUNTER — Ambulatory Visit: Payer: Self-pay | Admitting: Family Medicine

## 2014-04-20 ENCOUNTER — Encounter: Payer: Self-pay | Admitting: Family Medicine

## 2014-04-20 ENCOUNTER — Ambulatory Visit (INDEPENDENT_AMBULATORY_CARE_PROVIDER_SITE_OTHER): Payer: 59 | Admitting: Family Medicine

## 2014-04-20 VITALS — BP 185/98 | HR 77 | Temp 99.0°F | Ht 69.0 in | Wt 202.3 lb

## 2014-04-20 DIAGNOSIS — M25551 Pain in right hip: Secondary | ICD-10-CM

## 2014-04-20 DIAGNOSIS — M25552 Pain in left hip: Secondary | ICD-10-CM | POA: Insufficient documentation

## 2014-04-20 DIAGNOSIS — I10 Essential (primary) hypertension: Secondary | ICD-10-CM

## 2014-04-20 MED ORDER — LISINOPRIL 40 MG PO TABS: 20.0000 mg | ORAL_TABLET | Freq: Every day | ORAL | Status: DC

## 2014-04-20 NOTE — Patient Instructions (Signed)
Please take lisinopril 40 mg every day Please get your hip x-rays at [facility] on [address]  Thanks, Dr. Awanda Mink

## 2014-04-20 NOTE — Progress Notes (Signed)
Eric Nielsen is a 54 y.o. who presents today for HTN.    BP - Currently elevated, has been taking lisinopril 20 mg qd, states compliance.  Denies ADR from drug including palpitations, cough, edema, CP/SOB.  Denies exercise history or new concerning Sx.  Smokes 1/2-1 ppd and is interesting in quitting.   R Hip pain - Ongoing for several years now, states he was previously told he had degenerative disease of his hip that was incidentally found on CT scan of pelvis.  Does state lateral hip pain that radiates into the groin, worse with flexion of the hip and worse at end of the day.  Denies weakness of that leg, sensation changes, back pain, fever, chills, or weight loss.  Denies bowel/bladder as well.  Has not tried too much for this at this point and denies exercise.    Past Medical History  Diagnosis Date  . Arthritis   . HTN (hypertension)     History   Social History  . Marital Status: Married    Spouse Name: N/A    Number of Children: N/A  . Years of Education: N/A   Occupational History  . Not on file.   Social History Main Topics  . Smoking status: Current Every Day Smoker -- 1.00 packs/day  . Smokeless tobacco: Never Used  . Alcohol Use: Yes  . Drug Use: No  . Sexual Activity:    Partners: Female   Other Topics Concern  . Not on file   Social History Narrative    Family History  Problem Relation Age of Onset  . Hypertension Mother   . Cancer Mother     Lung and breast and brain  . Heart failure Father   . Hypertension Father     Current Outpatient Prescriptions on File Prior to Visit  Medication Sig Dispense Refill  . lisinopril (PRINIVIL,ZESTRIL) 20 MG tablet Take 1 tablet (20 mg total) by mouth daily. 30 tablet 1   No current facility-administered medications on file prior to visit.     Physical Exam Filed Vitals:   04/20/14 0953  BP: 185/98  Pulse: 77  Temp: 99 F (37.2 C)   Repeat BP L arm manual : 152/92  Gen: NAD, Well nourished, Well  developed HEENT: PERLA, EOMI, [place]/AT Neck: no JVD Cardio: RRR, No murmurs/gallops/rubs Lungs: CTA, no wheezes, rhonchi, crackles Abd: NABS, soft nontender nondistended MSK: ROM normal  Neuro: CN 2-12 intact, MS 5/5 B/L UE and LE, +2 patellar and achilles relfex b/l  Psych: AAO x 3  R Hip:  Hip: ROM  R hip: IR: 40 Deg, ER: 40 Deg, Flexion: 120 Deg, Extension: 100 Deg, Abduction: 45 Deg, Adduction: 45 Deg Strength IR: 5/5, ER: 5/5, Flexion: 5/5, Extension: 5/5, Abduction: 5/5, Adduction: 5/5 Pelvic alignment unremarkable to inspection and palpation. Standing hip rotation and gait without trendelenburg / unsteadiness. Greater trochanter without tenderness to palpation. No tenderness over piriformis and greater trochanter. No SI joint tenderness and normal minimal SI movement. + FADIR and FABER exam R hip w/o pain with compression       Chemistry      Component Value Date/Time   NA 137 12/24/2013 1039   K 4.0 12/24/2013 1039   CL 102 12/24/2013 1039   CO2 24 12/24/2013 1039   BUN 14 12/24/2013 1039   CREATININE 1.04 12/24/2013 1039   CREATININE 1.40* 04/18/2013 1454      Component Value Date/Time   CALCIUM 9.8 12/24/2013 1039   ALKPHOS 83 12/24/2013 1039  AST 24 12/24/2013 1039   ALT 21 12/24/2013 1039   BILITOT 0.5 12/24/2013 1039      Lab Results  Component Value Date   WBC 4.8 12/24/2013   HGB 16.5 12/24/2013   HCT 46.9 12/24/2013   MCV 93.8 12/24/2013   PLT 130* 12/24/2013

## 2014-04-20 NOTE — Assessment & Plan Note (Signed)
Concern for Hip OA vs tenosynovitis of anterior hip.  With pain radiating into groin, DJD of lumbar spine is another possibility.   - Obtain AP/lateral of both hips - F/U in 3 weeks to discuss results - If at that time does show degenerative disease, start on NSAID and tylenol and start doing exercises to help build hip strength up.

## 2014-04-20 NOTE — Assessment & Plan Note (Signed)
Elevated on repeat to 152/92.  Goal for him w/o DM or CKD would be around 150/90 but would shoot for lower since otherwise healthy - Increase lisinopril to 40 mg qd.  - F/U in 3-4 weeks and recheck along with consideration of BMET to check creatinine - If need additional agent at that time, would consider norvasc

## 2014-04-27 ENCOUNTER — Other Ambulatory Visit: Payer: Self-pay | Admitting: Family Medicine

## 2014-04-27 ENCOUNTER — Ambulatory Visit
Admission: RE | Admit: 2014-04-27 | Discharge: 2014-04-27 | Disposition: A | Discharge status: Home / Self Care | Payer: 59 | Source: Ambulatory Visit | Attending: Family Medicine | Admitting: Family Medicine

## 2014-04-27 DIAGNOSIS — M25551 Pain in right hip: Secondary | ICD-10-CM

## 2014-04-27 NOTE — Addendum Note (Signed)
Addended by: Johny Shears on: 04/27/2014 09:53 AM   Modules accepted: Orders

## 2014-05-07 ENCOUNTER — Other Ambulatory Visit: Payer: Self-pay | Admitting: Family Medicine

## 2014-05-07 MED ORDER — LISINOPRIL 20 MG PO TABS: 20.0000 mg | ORAL_TABLET | Freq: Every day | ORAL | Status: DC

## 2014-05-22 ENCOUNTER — Ambulatory Visit: Payer: 59 | Admitting: Family Medicine

## 2014-06-01 ENCOUNTER — Ambulatory Visit (INDEPENDENT_AMBULATORY_CARE_PROVIDER_SITE_OTHER): Payer: 59 | Admitting: Family Medicine

## 2014-06-01 ENCOUNTER — Encounter: Payer: Self-pay | Admitting: Family Medicine

## 2014-06-01 VITALS — BP 182/92 | HR 66 | Temp 98.9°F | Ht 71.0 in | Wt 208.2 lb

## 2014-06-01 DIAGNOSIS — I10 Essential (primary) hypertension: Secondary | ICD-10-CM

## 2014-06-01 DIAGNOSIS — M7061 Trochanteric bursitis, right hip: Secondary | ICD-10-CM | POA: Insufficient documentation

## 2014-06-01 DIAGNOSIS — M25551 Pain in right hip: Secondary | ICD-10-CM

## 2014-06-01 MED ORDER — AMLODIPINE BESYLATE 5 MG PO TABS: 5.0000 mg | ORAL_TABLET | Freq: Every day | ORAL | Status: DC

## 2014-06-01 MED ORDER — METHYLPREDNISOLONE ACETATE 40 MG/ML IJ SUSP
40.0000 mg | Freq: Once | INTRAMUSCULAR | Status: AC
  Administered 2014-06-01: 40 mg via INTRAMUSCULAR

## 2014-06-01 NOTE — Assessment & Plan Note (Signed)
Continues to be elevated ( up to 215 today w/o Sx, repeat to 182).   - Continue lisinopril 40 mg qd, question compliance - Addition of Norvasc 5 mg qd  - F/U in 3-4 weeks, consider BMET check at that time  - If continues to be uncontrolled despite addition of multiple medications, in 54 y/o, would consider additional w/u including causes of secondary HTN such as Conn's, hyperaldosteronism, RAS, etc

## 2014-06-01 NOTE — Patient Instructions (Signed)

## 2014-06-01 NOTE — Assessment & Plan Note (Addendum)
Previous Hip x-rays showing minimal hip OA of R hip.  Today his pain is more c/w trochanteric bursitis.  [place] of area shows inflamed superficial troch bursa.  - Still consider DJD of spine as possibility for his radiating pain, but also could be from piriformis.   - Start hip exercises for strengthening  - Injection into troch bursa today. Aspiration/Injection Procedure Note Eric Nielsen [id number] Jun 23, 1960  Procedure: Injection Indications: R hip trochanteric bursitis  Procedure Details Consent: Risks of procedure as well as the alternatives and risks of each were explained to the (patient/caregiver).  Consent for procedure obtained. Time Out: Verified patient identification, verified procedure, site/side was marked, verified correct patient position, special equipment/implants available, medications/allergies/relevent history reviewed, required imaging and test results available.  Performed.  A 3:1 lidocaine to 40 mg depo 1 mL was injected into the R trochanteric bursa region under sterile conditions using alcohol x 3.    A sterile dressing was applied.  Patient did tolerate procedure well. Estimated blood loss 1 mL   Eric Nielsen, Gaspar Bidding 06/01/2014, 11:10 AM

## 2014-06-01 NOTE — Progress Notes (Signed)
Eric Nielsen is a 54 y.o. male who presents today for R hip pain.  R hip pain - Ongoing for two days now, no injury, denies previous injury.  Does endorse occasional numbness down the medial anterior shin.  Denies frank weakness, is tender to palpation or sleeping on that side.  Has not tried much for this, denies low back pain.  HTN - Elevated to 224 systolic initially and repeat was 182/92.  Denies CP, palpitations, HA, blurred vision, weakness, dysarthria.  Does state compliance with lisinopril 40 mg qd w/o SE noted.   Past Medical History  Diagnosis Date  . Arthritis   . HTN (hypertension)     History  Smoking status  . Current Every Day Smoker -- 1.00 packs/day  Smokeless tobacco  . Never Used    Family History  Problem Relation Age of Onset  . Hypertension Mother   . Cancer Mother     Lung and breast and brain  . Heart failure Father   . Hypertension Father     Current Outpatient Prescriptions on File Prior to Visit  Medication Sig Dispense Refill  . lisinopril (PRINIVIL,ZESTRIL) 20 MG tablet Take 1 tablet (20 mg total) by mouth daily. 60 tablet 1   No current facility-administered medications on file prior to visit.    ROS: Per HPI.  All other systems reviewed and are negative.   Physical Exam Filed Vitals:   06/01/14 1049  BP: 182/92  Pulse:   Temp:     Physical Examination: General appearance - alert, well appearing, and in no distress Heart - normal rate and regular rhythm, no murmurs noted Hip: ROM IR: 80 Deg, ER: 80 Deg, Flexion: 120 Deg, Extension: 100 Deg, Abduction: 45 Deg, Adduction: 45 Deg Strength IR: 5/5, ER: 5/5, Flexion: 5/5, Extension: 5/5, Abduction: 5/5, Adduction: 5/5 Pelvic alignment unremarkable to inspection and palpation. Standing hip rotation and gait without trendelenburg / unsteadiness. Greater trochanter with tenderness to palpation. No tenderness over piriformis.  Negative FAIR testing and Freiberg testing  No SI joint  tenderness and normal minimal SI movement.  Negative Posterior Shear testing, negative Gaenslen test Negative SLR both sitting and supine DTR + 2/4, DP/PT +2/4 B/L      Chemistry      Component Value Date/Time   NA 137 12/24/2013 1039   K 4.0 12/24/2013 1039   CL 102 12/24/2013 1039   CO2 24 12/24/2013 1039   BUN 14 12/24/2013 1039   CREATININE 1.04 12/24/2013 1039   CREATININE 1.40* 04/18/2013 1454      Component Value Date/Time   CALCIUM 9.8 12/24/2013 1039   ALKPHOS 83 12/24/2013 1039   AST 24 12/24/2013 1039   ALT 21 12/24/2013 1039   BILITOT 0.5 12/24/2013 1039      Lab Results  Component Value Date   WBC 4.8 12/24/2013   HGB 16.5 12/24/2013   HCT 46.9 12/24/2013   MCV 93.8 12/24/2013   PLT 130* 12/24/2013   No results found for: TSH Lab Results  Component Value Date   HGBA1C 5.7 12/24/2013

## 2014-06-02 ENCOUNTER — Other Ambulatory Visit: Payer: Self-pay | Admitting: Family Medicine

## 2014-06-02 MED ORDER — LISINOPRIL 40 MG PO TABS: 40.0000 mg | ORAL_TABLET | Freq: Every day | ORAL | Status: DC

## 2014-06-02 NOTE — Telephone Encounter (Signed)
Checking status of refill on lisinopril, pt seen yesterday but rx not at pharmacy, pt goes to [facility]/[address]

## 2014-06-23 ENCOUNTER — Ambulatory Visit: Payer: 59 | Admitting: Family Medicine

## 2014-07-23 ENCOUNTER — Encounter: Payer: Self-pay | Admitting: Family Medicine

## 2014-07-23 ENCOUNTER — Ambulatory Visit (INDEPENDENT_AMBULATORY_CARE_PROVIDER_SITE_OTHER): Payer: 59 | Admitting: Family Medicine

## 2014-07-23 VITALS — BP 189/100 | HR 95 | Temp 99.1°F | Ht 71.0 in | Wt 204.7 lb

## 2014-07-23 DIAGNOSIS — M7711 Lateral epicondylitis, right elbow: Secondary | ICD-10-CM | POA: Insufficient documentation

## 2014-07-23 DIAGNOSIS — M25551 Pain in right hip: Secondary | ICD-10-CM | POA: Diagnosis not present

## 2014-07-23 DIAGNOSIS — Z1211 Encounter for screening for malignant neoplasm of colon: Secondary | ICD-10-CM

## 2014-07-23 DIAGNOSIS — I10 Essential (primary) hypertension: Secondary | ICD-10-CM

## 2014-07-23 LAB — BASIC METABOLIC PANEL
BUN: 13 mg/dL (ref 6–23)
CHLORIDE: 101 meq/L (ref 96–112)
CO2: 21 meq/L (ref 19–32)
Calcium: 9.4 mg/dL (ref 8.4–10.5)
Creat: 1.04 mg/dL (ref 0.50–1.35)
Glucose, Bld: 116 mg/dL — ABNORMAL HIGH (ref 70–99)
POTASSIUM: 3.8 meq/L (ref 3.5–5.3)
SODIUM: 136 meq/L (ref 135–145)

## 2014-07-23 NOTE — Progress Notes (Signed)
Eric Nielsen is a 54 y.o. male who presents today for R hip pain.  R elbow pain - Ongoing for about 2-3 days now.  Did mow lawn which he thinks might have contributed to this.  Denies paresthesias, weakness, or inability to move his hand/fingers.  TTP lateral epicondyle, especially with extension and supination of the elbow.  Has not tried anything for this to date.  Otherwise has never had this before, is R handed, and has not had it in the L arm.    HTN -   Visit from 06/01/14 - Elevated to 109 systolic initially and repeat was 182/92.  Denies CP, palpitations, HA, blurred vision, weakness, dysarthria.  Does state compliance with lisinopril 40 mg qd w/o SE noted.   Visit from 07/23/14 - Elevated initially to 189/100 L arm with machine.  Repeat was 150/82.  Started on Norvasc last time and has done well with this. Denies HA, blurred vision, palpitations, weakness, dysarthria.  Compliant with both Norvasc and Lisinopril  Past Medical History  Diagnosis Date  . Arthritis   . HTN (hypertension)     History  Smoking status  . Current Every Day Smoker -- 1.00 packs/day  Smokeless tobacco  . Never Used    Family History  Problem Relation Age of Onset  . Hypertension Mother   . Cancer Mother     Lung and breast and brain  . Heart failure Father   . Hypertension Father     Current Outpatient Prescriptions on File Prior to Visit  Medication Sig Dispense Refill  . amLODipine (NORVASC) 5 MG tablet Take 1 tablet (5 mg total) by mouth daily. 90 tablet 3  . lisinopril (PRINIVIL,ZESTRIL) 40 MG tablet Take 1 tablet (40 mg total) by mouth daily. 90 tablet 1   No current facility-administered medications on file prior to visit.    ROS: Per HPI.  All other systems reviewed and are negative.   Physical Exam Filed Vitals:   07/23/14 0826  BP: 189/100  Pulse: 95  Temp: 99.1 F (37.3 C)   Repeat BP L arm - 150/82 sitting, after 5 minutes of rest   Physical Examination: General appearance -  alert, well appearing, and in no distress Heart - normal rate and regular rhythm, no murmurs noted R arm - Normal inspection, TTP at the lateral epicondyle and extensor wad.  Full ROM in F/E, MS 5/5 in all direction, Neurovascularly intact.  Pain worse with extension of the wrist while elbow extended.       Chemistry      Component Value Date/Time   NA 137 12/24/2013 1039   K 4.0 12/24/2013 1039   CL 102 12/24/2013 1039   CO2 24 12/24/2013 1039   BUN 14 12/24/2013 1039   CREATININE 1.04 12/24/2013 1039   CREATININE 1.40* 04/18/2013 1454      Component Value Date/Time   CALCIUM 9.8 12/24/2013 1039   ALKPHOS 83 12/24/2013 1039   AST 24 12/24/2013 1039   ALT 21 12/24/2013 1039   BILITOT 0.5 12/24/2013 1039      Lab Results  Component Value Date   WBC 4.8 12/24/2013   HGB 16.5 12/24/2013   HCT 46.9 12/24/2013   MCV 93.8 12/24/2013   PLT 130* 12/24/2013   No results found for: TSH Lab Results  Component Value Date   HGBA1C 5.7 12/24/2013

## 2014-07-23 NOTE — Assessment & Plan Note (Signed)
Has not obtained colonoscopy to date - Will give paper again today and reiterate the importance of having this done.

## 2014-07-23 NOTE — Assessment & Plan Note (Signed)
BP better after rest.  Ideally would like systolic a little lower but overall happy with this - Continue Norvasc and Lisonipril as directed - Obtain BMET to assess kidney function - F/U in 6 weeks  - Advised on salt limit per day at 2.5 gm and discussed what to look for on food label.  Pt did well on teach back method for this.

## 2014-07-23 NOTE — Assessment & Plan Note (Signed)
Resolved after injection.  Confirming trochanteric bursitis

## 2014-07-23 NOTE — Assessment & Plan Note (Signed)
Conservative management  - Start wearing elbow brace to the proximal radial area - Start Ibuprofen 400-600 mg TID for the next 2-3 weeks - Curtail inciting painful movements - If no improvement in next 6 weeks, can consider further management including eccentric exercise and possibly steroid injection for pain relief.

## 2014-07-23 NOTE — Patient Instructions (Signed)
Please get your colonscopy completed Please get the elbow brace (tennis elbow brace) We will see you back in 6 weeks.  Thanks Dr. Awanda Mink

## 2014-07-24 ENCOUNTER — Encounter: Payer: Self-pay | Admitting: Family Medicine

## 2014-11-17 ENCOUNTER — Other Ambulatory Visit: Payer: Self-pay | Admitting: Family Medicine

## 2014-11-17 MED ORDER — LISINOPRIL 40 MG PO TABS: 40.0000 mg | ORAL_TABLET | Freq: Every day | ORAL | Status: DC

## 2014-11-17 NOTE — Telephone Encounter (Signed)
BP med Refilled x1.  Patient needs to be seen for HTN follow up.  Was elevated to 180/100's last visit.  Please advise.

## 2014-11-17 NOTE — Telephone Encounter (Signed)
Pt has an appt on 11-27-14. Naama Sappington,CMA

## 2014-11-17 NOTE — Telephone Encounter (Signed)
Needs refill on lisonpril  [facility] on [address]

## 2014-11-27 ENCOUNTER — Ambulatory Visit: Payer: 59 | Admitting: Family Medicine

## 2014-12-18 ENCOUNTER — Ambulatory Visit: Payer: 59 | Admitting: Family Medicine

## 2015-01-15 ENCOUNTER — Encounter: Payer: Self-pay | Admitting: Family Medicine

## 2015-01-15 ENCOUNTER — Ambulatory Visit (INDEPENDENT_AMBULATORY_CARE_PROVIDER_SITE_OTHER): Payer: 59 | Admitting: Family Medicine

## 2015-01-15 VITALS — BP 139/85 | HR 87 | Temp 98.6°F | Ht 71.0 in | Wt 195.5 lb

## 2015-01-15 DIAGNOSIS — I10 Essential (primary) hypertension: Secondary | ICD-10-CM

## 2015-01-15 DIAGNOSIS — Z0000 Encounter for general adult medical examination without abnormal findings: Secondary | ICD-10-CM | POA: Diagnosis not present

## 2015-01-15 DIAGNOSIS — Z23 Encounter for immunization: Secondary | ICD-10-CM

## 2015-01-15 DIAGNOSIS — F17200 Nicotine dependence, unspecified, uncomplicated: Secondary | ICD-10-CM

## 2015-01-15 DIAGNOSIS — M25551 Pain in right hip: Secondary | ICD-10-CM | POA: Diagnosis not present

## 2015-01-15 MED ORDER — AMLODIPINE BESYLATE 5 MG PO TABS: 5.0000 mg | ORAL_TABLET | Freq: Every day | ORAL | Status: DC

## 2015-01-15 MED ORDER — LISINOPRIL 40 MG PO TABS: 40.0000 mg | ORAL_TABLET | Freq: Every day | ORAL | Status: DC

## 2015-01-15 NOTE — Assessment & Plan Note (Deleted)
Patient would like another injection for this.  Would like to see Dr Awanda Mink if able.

## 2015-01-15 NOTE — Assessment & Plan Note (Signed)
At goal.  Continues to smoke. - Continue current medication regimen - BMET, lipid panel ordered.  Will come in when fasting. - Refills provided for Lisinopril and Norvasc - Follow up 1 year for annual exam.

## 2015-01-15 NOTE — Assessment & Plan Note (Signed)
Would like to see Dr Awanda Mink again for injection.  Advised injection also available here.  Apparently, patient seeking disability for this. - Referral to sports medicine for bursa injection - Follow up as needed.

## 2015-01-15 NOTE — Patient Instructions (Addendum)
Make an appointment for Fasting labs.  Your refills have been sent into the pharmacy.  Get your colonoscopy done.  Plan to see me in 1 year for annual exam or sooner if needed.  Health Maintenance, Male A healthy lifestyle and preventative care can promote health and wellness.  Maintain regular health, dental, and eye exams.  Eat a healthy diet. Foods like vegetables, fruits, whole grains, low-fat dairy products, and lean protein foods contain the nutrients you need and are low in calories. Decrease your intake of foods high in solid fats, added sugars, and salt. Get information about a proper diet from your health care provider, if necessary.  Regular physical exercise is one of the most important things you can do for your health. Most adults should get at least 150 minutes of moderate-intensity exercise (any activity that increases your heart rate and causes you to sweat) each week. In addition, most adults need muscle-strengthening exercises on 2 or more days a week.   Maintain a healthy weight. The body mass index (BMI) is a screening tool to identify possible weight problems. It provides an estimate of body fat based on height and weight. Your health care provider can find your BMI and can help you achieve or maintain a healthy weight. For males 20 years and older:  A BMI below 18.5 is considered underweight.  A BMI of 18.5 to 24.9 is normal.  A BMI of 25 to 29.9 is considered overweight.  A BMI of 30 and above is considered obese.  Maintain normal blood lipids and cholesterol by exercising and minimizing your intake of saturated fat. Eat a balanced diet with plenty of fruits and vegetables. Blood tests for lipids and cholesterol should begin at age 61 and be repeated every 5 years. If your lipid or cholesterol levels are high, you are over age 45, or you are at high risk for heart disease, you may need your cholesterol levels checked more frequently.Ongoing high lipid and cholesterol  levels should be treated with medicines if diet and exercise are not working.  If you smoke, find out from your health care provider how to quit. If you do not use tobacco, do not start.  Lung cancer screening is recommended for adults aged 92-80 years who are at high risk for developing lung cancer because of a history of smoking. A yearly low-dose CT scan of the lungs is recommended for people who have at least a 30-pack-year history of smoking and are current smokers or have quit within the past 15 years. A pack year of smoking is smoking an average of 1 pack of cigarettes a day for 1 year (for example, a 30-pack-year history of smoking could mean smoking 1 pack a day for 30 years or 2 packs a day for 15 years). Yearly screening should continue until the smoker has stopped smoking for at least 15 years. Yearly screening should be stopped for people who develop a health problem that would prevent them from having lung cancer treatment.  If you choose to drink alcohol, do not have more than 2 drinks per day. One drink is considered to be 12 oz (360 mL) of beer, 5 oz (150 mL) of wine, or 1.5 oz (45 mL) of liquor.  Avoid the use of street drugs. Do not share needles with anyone. Ask for help if you need support or instructions about stopping the use of drugs.  High blood pressure causes heart disease and increases the risk of stroke. High blood pressure  is more likely to develop in:  People who have blood pressure in the end of the normal range (100-139/85-89 mm Hg).  People who are overweight or obese.  People who are African American.  If you are 57-85 years of age, have your blood pressure checked every 3-5 years. If you are 46 years of age or older, have your blood pressure checked every year. You should have your blood pressure measured twice--once when you are at a hospital or clinic, and once when you are not at a hospital or clinic. Record the average of the two measurements. To check your  blood pressure when you are not at a hospital or clinic, you can use:  An automated blood pressure machine at a pharmacy.  A home blood pressure monitor.  If you are 67-55 years old, ask your health care provider if you should take aspirin to prevent heart disease.  Diabetes screening involves taking a blood sample to check your fasting blood sugar level. This should be done once every 3 years after age 24 if you are at a normal weight and without risk factors for diabetes. Testing should be considered at a younger age or be carried out more frequently if you are overweight and have at least 1 risk factor for diabetes.  Colorectal cancer can be detected and often prevented. Most routine colorectal cancer screening begins at the age of 57 and continues through age 56. However, your health care provider may recommend screening at an earlier age if you have risk factors for colon cancer. On a yearly basis, your health care provider may provide home test kits to check for hidden blood in the stool. A small camera at the end of a tube may be used to directly examine the colon (sigmoidoscopy or colonoscopy) to detect the earliest forms of colorectal cancer. Talk to your health care provider about this at age 41 when routine screening begins. A direct exam of the colon should be repeated every 5-10 years through age 38, unless early forms of precancerous polyps or small growths are found.  People who are at an increased risk for hepatitis B should be screened for this virus. You are considered at high risk for hepatitis B if:  You were born in a country where hepatitis B occurs often. Talk with your health care provider about which countries are considered high risk.  Your parents were born in a high-risk country and you have not received a shot to protect against hepatitis B (hepatitis B vaccine).  You have HIV or AIDS.  You use needles to inject street drugs.  You live with, or have sex with,  someone who has hepatitis B.  You are a man who has sex with other men (MSM).  You get hemodialysis treatment.  You take certain medicines for conditions like cancer, organ transplantation, and autoimmune conditions.  Hepatitis C blood testing is recommended for all people born from [birth year] through 1963 and any individual with known risk factors for hepatitis C.  Healthy men should no longer receive prostate-specific antigen (PSA) blood tests as part of routine cancer screening. Talk to your health care provider about prostate cancer screening.  Testicular cancer screening is not recommended for adolescents or adult males who have no symptoms. Screening includes self-exam, a health care provider exam, and other screening tests. Consult with your health care provider about any symptoms you have or any concerns you have about testicular cancer.  Practice safe sex. Use condoms and avoid high-risk  sexual practices to reduce the spread of sexually transmitted infections (STIs).  You should be screened for STIs, including gonorrhea and chlamydia if:  You are sexually active and are younger than 24 years.  You are older than 24 years, and your health care provider tells you that you are at risk for this type of infection.  Your sexual activity has changed since you were last screened, and you are at an increased risk for chlamydia or gonorrhea. Ask your health care provider if you are at risk.  If you are at risk of being infected with HIV, it is recommended that you take a prescription medicine daily to prevent HIV infection. This is called pre-exposure prophylaxis (PrEP). You are considered at risk if:  You are a man who has sex with other men (MSM).  You are a heterosexual man who is sexually active with multiple partners.  You take drugs by injection.  You are sexually active with a partner who has HIV.  Talk with your health care provider about whether you are at high risk of being  infected with HIV. If you choose to begin PrEP, you should first be tested for HIV. You should then be tested every 3 months for as long as you are taking PrEP.  Use sunscreen. Apply sunscreen liberally and repeatedly throughout the day. You should seek shade when your shadow is shorter than you. Protect yourself by wearing long sleeves, pants, a wide-brimmed hat, and sunglasses year round whenever you are outdoors.  Tell your health care provider of new moles or changes in moles, especially if there is a change in shape or color. Also, tell your health care provider if a mole is larger than the size of a pencil eraser.  A one-time screening for abdominal aortic aneurysm (AAA) and surgical repair of large AAAs by ultrasound is recommended for men aged 30-75 years who are current or former smokers.  Stay current with your vaccines (immunizations).   This information is not intended to replace advice given to you by your health care provider. Make sure you discuss any questions you have with your health care provider.   Document Released: 09/02/2007 Document Revised: 03/27/2014 Document Reviewed: 08/01/2010 Elsevier Interactive Patient Education [employer].  Colonoscopy A colonoscopy is an exam to look at your colon. This exam can help find lumps (tumors), growths (polyps), bleeding, and redness and puffiness (inflammation) in your colon.  BEFORE THE PROCEDURE  Ask your doctor about changing or stopping your regular medicines.  You may need to drink a large amount of a special liquid (oral bowel prep). You start drinking this the day before your procedure. It will cause you to have watery poop (stool). This cleans out your colon.  Do not eat or drink anything else once you have started the bowel prep, unless your doctor tells you it is safe to do so.  Make plans for someone to drive you home after the procedure. PROCEDURE  You will be given medicine to help you relax  (sedative).  You will lie on your side with your knees bent.  A tube with a camera on the end is put in the opening of your butt (anus) and into your colon. Pictures are sent to a computer screen. Your doctor will look for anything that is not normal.  Your doctor may take a tissue sample (biopsy) from your colon to be looked at more closely.  The exam is finished when your doctor has viewed all of  the colon. AFTER THE PROCEDURE  Do not drive for 24 hours after the exam.  You may have a small amount of blood in your poop. This is normal.  You may pass gas and have belly (abdominal) cramps. This is normal.  Ask when your test results will be ready. Make sure you get your test results.   This information is not intended to replace advice given to you by your health care provider. Make sure you discuss any questions you have with your health care provider.   Document Released: 04/08/2010 Document Revised: 03/11/2013 Document Reviewed: 11/11/2012 Elsevier Interactive Patient Education [employer].

## 2015-01-15 NOTE — Progress Notes (Signed)
Eric Nielsen presents to office today for his annual physical examination.  Married with 2 children, 1 boy and 1 girl.  Concerns today include:  1. Hypertension Blood pressure at home: does not check Blood pressure today: 139/85 Meds: Compliant with Norvasc and Lisinopril Side effects: none ROS: Denies headache, dizziness, visual changes, nausea, vomiting, chest pain, abdominal pain or shortness of breath.  2. Right Hip pain Notes that he is seeking disability.  He notes that he was previously dx with bursitis.  Seeing an orthopedic doctor for this.  Currently, getting injections.  Last injection was 06/01/14.  Denies weakness, numbness, tingling or falls.  3. Tobacco use d/o Patient smoking 1/2 ppd.  [name].  Previously smoked 1ppd of [place].  Patient endorses occ smokers cough.  NO CP, SOB.  Not ready to quit yet.  Last eye exam: 11/2014, disability eye doctor Last dental exam: >1 year Last colonoscopy: will schedule Immunizations needed: Tdap Refills needed today: Norvasc, Lisinopril   Past Medical History  Diagnosis Date  . Arthritis   . HTN (hypertension)    Social History   Social History  . Marital Status: Married    Spouse Name: N/A  . Number of Children: N/A  . Years of Education: N/A   Occupational History  . Not on file.   Social History Main Topics  . Smoking status: Current Every Day Smoker -- 0.50 packs/day  . Smokeless tobacco: Never Used  . Alcohol Use: 1.8 oz/week    3 Cans of beer per week  . Drug Use: No  . Sexual Activity:    Partners: Female   Other Topics Concern  . Not on file   Social History Narrative   Past Surgical History  Procedure Laterality Date  . Incision and drainage perirectal abscess N/A 10/14/2012    Procedure: IRRIGATION AND DEBRIDEMENT PERIRECTAL ABSCESS;  Surgeon: Eric Jarred, MD;  Location: [facility];  Service: General;  Laterality: N/A;   Family History  Problem Relation Age of Onset  . Hypertension Mother     . Cancer Mother     Lung and breast and brain  . Heart failure Father   . Hypertension Father   . Hypertension Brother   . Diabetes Brother     ROS: Review of Systems Constitutional: negative Eyes: negative Ears, nose, mouth, throat, and face: negative Respiratory: positive for cough and smokers Cardiovascular: negative Gastrointestinal: negative Genitourinary:negative Integument/breast: negative Hematologic/lymphatic: negative Musculoskeletal:negative Neurological: negative Behavioral/Psych: negative Endocrine: negative Allergic/Immunologic: negative   Physical exam BP 139/85 mmHg  Pulse 87  Temp(Src) 98.6 F (37 C) (Oral)  Ht 5\' 11"  (1.803 m)  Wt 195 lb 8 oz (88.678 kg)  BMI 27.28 kg/m2 General appearance: alert, cooperative, appears stated age and no distress Head: Normocephalic, without obvious abnormality, atraumatic Eyes: conjunctivae/corneas clear. PERRL, EOM's intact. Fundi benign. Ears: normal TM's and external ear canals both ears Nose: Nares normal. Septum midline. Mucosa normal. No drainage or sinus tenderness. Throat: lips, mucosa, and tongue normal; teeth and gums normal Neck: no adenopathy, supple, symmetrical, trachea midline and thyroid not enlarged, symmetric, no tenderness/mass/nodules Back: symmetric, no curvature. ROM normal. No CVA tenderness. Lungs: clear to auscultation bilaterally Chest wall: no tenderness Heart: regular rate and rhythm, S1, S2 normal, no murmur, click, rub or gallop Abdomen: soft, non-tender; bowel sounds normal; no masses,  no organomegaly Extremities: extremities normal, atraumatic, no cyanosis or edema and +TTP along lateral aspect of upper RLE Pulses: 2+ and symmetric Skin: Skin color, texture, turgor  normal. No rashes or lesions Lymph nodes: Cervical, supraclavicular, and axillary nodes normal. Neurologic: Alert and oriented X 3, normal strength and tone. Normal symmetric reflexes. Normal coordination and gait     Assessment/ Plan: Patient with h/o HTN and tobacco use d/o here for annual physical exam.   HTN, goal below 140/90 At goal.  Continues to smoke. - Continue current medication regimen - BMET, lipid panel ordered.  Will come in when fasting. - Refills provided for Lisinopril and Norvasc - Follow up 1 year for annual exam.  Tobacco use disorder Pre contemplative, though has cut back quite a bit. - Continued smoking cessation counseling   Right hip pain Would like to see Dr Eric Nielsen again for injection.  Advised injection also available here.  Apparently, patient seeking disability for this. - Referral to sports medicine for bursa injection - Follow up as needed.   TDap administered at today's appt.  Patient to schedule colonoscopy.  Discussed alternatives if patient decides he is unwilling to proceed with colonoscopy.  Eric Nielsen M. Eric Ripple, DO PGY-2, [place]

## 2015-01-15 NOTE — Assessment & Plan Note (Signed)
Pre contemplative, though has cut back quite a bit. - Continued smoking cessation counseling

## 2015-01-17 ENCOUNTER — Emergency Department (HOSPITAL_COMMUNITY)
Admission: EM | Admit: 2015-01-17 | Discharge: 2015-01-17 | Disposition: A | Discharge status: Home / Self Care | Payer: 59

## 2015-01-17 NOTE — ED Notes (Signed)
Pt walked out of department and left the building.

## 2015-01-22 ENCOUNTER — Other Ambulatory Visit: Payer: 59

## 2015-01-22 ENCOUNTER — Ambulatory Visit (INDEPENDENT_AMBULATORY_CARE_PROVIDER_SITE_OTHER): Payer: 59 | Admitting: Family Medicine

## 2015-01-22 ENCOUNTER — Encounter: Payer: Self-pay | Admitting: Family Medicine

## 2015-01-22 VITALS — BP 145/90 | HR 68 | Ht 71.0 in | Wt 197.0 lb

## 2015-01-22 DIAGNOSIS — M7061 Trochanteric bursitis, right hip: Secondary | ICD-10-CM | POA: Diagnosis not present

## 2015-01-22 DIAGNOSIS — I10 Essential (primary) hypertension: Secondary | ICD-10-CM

## 2015-01-22 LAB — COMPREHENSIVE METABOLIC PANEL
ALBUMIN: 4.2 g/dL (ref 3.6–5.1)
ALT: 35 U/L (ref 9–46)
AST: 34 U/L (ref 10–35)
Alkaline Phosphatase: 71 U/L (ref 40–115)
BUN: 16 mg/dL (ref 7–25)
CHLORIDE: 98 mmol/L (ref 98–110)
CO2: 32 mmol/L — AB (ref 20–31)
CREATININE: 1.09 mg/dL (ref 0.70–1.33)
Calcium: 9.6 mg/dL (ref 8.6–10.3)
GLUCOSE: 102 mg/dL — AB (ref 65–99)
Potassium: 3.8 mmol/L (ref 3.5–5.3)
Sodium: 136 mmol/L (ref 135–146)
Total Bilirubin: 0.7 mg/dL (ref 0.2–1.2)
Total Protein: 8 g/dL (ref 6.1–8.1)

## 2015-01-22 LAB — LIPID PANEL
CHOL/HDL RATIO: 3.6 ratio (ref <=5.0)
Cholesterol: 124 mg/dL — ABNORMAL LOW (ref 125–200)
HDL: 34 mg/dL — AB (ref >=40)
LDL Cholesterol: 77 mg/dL (ref <130)
Triglycerides: 63 mg/dL (ref <150)
VLDL: 13 mg/dL (ref <30)

## 2015-01-22 MED ORDER — METHYLPREDNISOLONE ACETATE 80 MG/ML IJ SUSP
80.0000 mg | Freq: Once | INTRAMUSCULAR | Status: AC
  Administered 2015-01-22: 80 mg via INTRA_ARTICULAR

## 2015-01-22 NOTE — Progress Notes (Signed)
cmp and flp done today Arin Vanosdol 

## 2015-01-23 ENCOUNTER — Observation Stay (HOSPITAL_COMMUNITY): Payer: 59

## 2015-01-23 ENCOUNTER — Emergency Department (HOSPITAL_COMMUNITY): Payer: 59 | Admitting: Anesthesiology

## 2015-01-23 ENCOUNTER — Encounter (HOSPITAL_COMMUNITY)
Admission: EM | Disposition: A | Discharge status: Home / Self Care | Payer: Self-pay | Source: Home / Self Care | Attending: Emergency Medicine

## 2015-01-23 ENCOUNTER — Observation Stay (HOSPITAL_COMMUNITY)
Admission: EM | Admit: 2015-01-23 | Discharge: 2015-01-24 | Disposition: A | Discharge status: Home / Self Care | Payer: 59 | Attending: General Surgery | Admitting: General Surgery

## 2015-01-23 ENCOUNTER — Encounter (HOSPITAL_COMMUNITY): Payer: Self-pay | Admitting: *Deleted

## 2015-01-23 DIAGNOSIS — K641 Second degree hemorrhoids: Secondary | ICD-10-CM | POA: Insufficient documentation

## 2015-01-23 DIAGNOSIS — K6289 Other specified diseases of anus and rectum: Principal | ICD-10-CM | POA: Diagnosis present

## 2015-01-23 DIAGNOSIS — F17210 Nicotine dependence, cigarettes, uncomplicated: Secondary | ICD-10-CM | POA: Diagnosis not present

## 2015-01-23 DIAGNOSIS — I10 Essential (primary) hypertension: Secondary | ICD-10-CM | POA: Diagnosis not present

## 2015-01-23 DIAGNOSIS — K611 Rectal abscess: Secondary | ICD-10-CM | POA: Insufficient documentation

## 2015-01-23 HISTORY — PX: INCISION AND DRAINAGE PERIRECTAL ABSCESS: SHX1804

## 2015-01-23 HISTORY — DX: Rectal abscess: K61.1

## 2015-01-23 LAB — COMPREHENSIVE METABOLIC PANEL
ALK PHOS: 74 U/L (ref 38–126)
ALT: 40 U/L (ref 17–63)
AST: 48 U/L — ABNORMAL HIGH (ref 15–41)
Albumin: 4.1 g/dL (ref 3.5–5.0)
Anion gap: 10 (ref 5–15)
BILIRUBIN TOTAL: 0.7 mg/dL (ref 0.3–1.2)
BUN: 14 mg/dL (ref 6–20)
CO2: 25 mmol/L (ref 22–32)
CREATININE: 0.99 mg/dL (ref 0.61–1.24)
Calcium: 9.5 mg/dL (ref 8.9–10.3)
Chloride: 99 mmol/L — ABNORMAL LOW (ref 101–111)
Glucose, Bld: 137 mg/dL — ABNORMAL HIGH (ref 65–99)
Potassium: 3.8 mmol/L (ref 3.5–5.1)
Sodium: 134 mmol/L — ABNORMAL LOW (ref 135–145)
TOTAL PROTEIN: 8.3 g/dL — AB (ref 6.5–8.1)

## 2015-01-23 LAB — CBC WITH DIFFERENTIAL/PLATELET
Basophils Absolute: 0 10*3/uL (ref 0.0–0.1)
Basophils Relative: 0 %
EOS ABS: 0 10*3/uL (ref 0.0–0.7)
Eosinophils Relative: 1 %
HCT: 48.6 % (ref 39.0–52.0)
Hemoglobin: 17.6 g/dL — ABNORMAL HIGH (ref 13.0–17.0)
Lymphocytes Relative: 37 %
Lymphs Abs: 1.9 10*3/uL (ref 0.7–4.0)
MCH: 33.4 pg (ref 26.0–34.0)
MCHC: 36.2 g/dL — AB (ref 30.0–36.0)
MCV: 92.2 fL (ref 78.0–100.0)
MONOS PCT: 14 %
Monocytes Absolute: 0.7 10*3/uL (ref 0.1–1.0)
NEUTROS PCT: 48 %
Neutro Abs: 2.5 10*3/uL (ref 1.7–7.7)
Platelets: 120 10*3/uL — ABNORMAL LOW (ref 150–400)
RBC: 5.27 MIL/uL (ref 4.22–5.81)
RDW: 12.5 % (ref 11.5–15.5)
WBC: 5.1 10*3/uL (ref 4.0–10.5)

## 2015-01-23 SURGERY — INCISION AND DRAINAGE, ABSCESS, PERIRECTAL
Anesthesia: General | Site: Buttocks

## 2015-01-23 MED ORDER — DIPHENHYDRAMINE HCL 12.5 MG/5ML PO ELIX: 12.5000 mg | ORAL_SOLUTION | Freq: Four times a day (QID) | ORAL | Status: DC | PRN

## 2015-01-23 MED ORDER — HYDROMORPHONE HCL 1 MG/ML IJ SOLN
1.0000 mg | Freq: Once | INTRAMUSCULAR | Status: AC
  Administered 2015-01-23: 1 mg via INTRAVENOUS
  Filled 2015-01-23: qty 1

## 2015-01-23 MED ORDER — METRONIDAZOLE IN NACL 5-0.79 MG/ML-% IV SOLN
500.0000 mg | INTRAVENOUS | Status: AC
  Administered 2015-01-23: 500 mg via INTRAVENOUS
  Filled 2015-01-23: qty 100

## 2015-01-23 MED ORDER — GLYCOPYRROLATE 0.2 MG/ML IJ SOLN
INTRAMUSCULAR | Status: DC | PRN
  Administered 2015-01-23: 0.2 mg via INTRAVENOUS

## 2015-01-23 MED ORDER — ACETAMINOPHEN 650 MG RE SUPP: 650.0000 mg | Freq: Four times a day (QID) | RECTAL | Status: DC | PRN

## 2015-01-23 MED ORDER — MEPERIDINE HCL 25 MG/ML IJ SOLN: 6.2500 mg | INTRAMUSCULAR | Status: DC | PRN

## 2015-01-23 MED ORDER — LISINOPRIL 40 MG PO TABS
40.0000 mg | ORAL_TABLET | Freq: Every day | ORAL | Status: DC
  Administered 2015-01-23 – 2015-01-24 (×2): 40 mg via ORAL
  Filled 2015-01-23 (×2): qty 1

## 2015-01-23 MED ORDER — IOHEXOL 300 MG/ML  SOLN
25.0000 mL | INTRAMUSCULAR | Status: AC
  Administered 2015-01-23 (×2): 25 mL via ORAL

## 2015-01-23 MED ORDER — ONDANSETRON HCL 4 MG/2ML IJ SOLN
4.0000 mg | Freq: Four times a day (QID) | INTRAMUSCULAR | Status: DC | PRN
  Administered 2015-01-23: 4 mg via INTRAVENOUS
  Filled 2015-01-23: qty 2

## 2015-01-23 MED ORDER — CEFAZOLIN SODIUM-DEXTROSE 2-3 GM-% IV SOLR
2.0000 g | INTRAVENOUS | Status: AC
  Administered 2015-01-23: 2 g via INTRAVENOUS
  Filled 2015-01-23: qty 50

## 2015-01-23 MED ORDER — MIDAZOLAM HCL 2 MG/2ML IJ SOLN
INTRAMUSCULAR | Status: AC
  Filled 2015-01-23: qty 4

## 2015-01-23 MED ORDER — LABETALOL HCL 5 MG/ML IV SOLN
INTRAVENOUS | Status: AC
  Administered 2015-01-23: 10 mg via INTRAVENOUS
  Filled 2015-01-23: qty 4

## 2015-01-23 MED ORDER — DOCUSATE SODIUM 100 MG PO CAPS
100.0000 mg | ORAL_CAPSULE | Freq: Two times a day (BID) | ORAL | Status: DC
  Administered 2015-01-23 – 2015-01-24 (×2): 100 mg via ORAL
  Filled 2015-01-23 (×2): qty 1

## 2015-01-23 MED ORDER — ONDANSETRON 4 MG PO TBDP: 4.0000 mg | ORAL_TABLET | Freq: Four times a day (QID) | ORAL | Status: DC | PRN

## 2015-01-23 MED ORDER — LACTATED RINGERS IV SOLN
INTRAVENOUS | Status: DC | PRN
  Administered 2015-01-23: 13:00:00 via INTRAVENOUS

## 2015-01-23 MED ORDER — PROPOFOL 10 MG/ML IV BOLUS
INTRAVENOUS | Status: AC
  Filled 2015-01-23: qty 20

## 2015-01-23 MED ORDER — BUPIVACAINE-EPINEPHRINE 0.25% -1:200000 IJ SOLN
INTRAMUSCULAR | Status: DC | PRN
  Administered 2015-01-23: 10 mL

## 2015-01-23 MED ORDER — BUPIVACAINE-EPINEPHRINE (PF) 0.25% -1:200000 IJ SOLN
INTRAMUSCULAR | Status: AC
  Filled 2015-01-23: qty 30

## 2015-01-23 MED ORDER — LABETALOL HCL 5 MG/ML IV SOLN
5.0000 mg | INTRAVENOUS | Status: DC | PRN
  Administered 2015-01-23: 10 mg via INTRAVENOUS

## 2015-01-23 MED ORDER — PROPOFOL 10 MG/ML IV BOLUS
INTRAVENOUS | Status: DC | PRN
  Administered 2015-01-23: 200 mg via INTRAVENOUS

## 2015-01-23 MED ORDER — LIDOCAINE HCL 2 % EX GEL
CUTANEOUS | Status: AC
  Filled 2015-01-23: qty 20

## 2015-01-23 MED ORDER — ZOLPIDEM TARTRATE 5 MG PO TABS
5.0000 mg | ORAL_TABLET | Freq: Every evening | ORAL | Status: DC | PRN
Start: 2015-01-23 — End: 2015-01-24

## 2015-01-23 MED ORDER — POLYETHYLENE GLYCOL 3350 17 G PO PACK: 17.0000 g | PACK | Freq: Every day | ORAL | Status: DC | PRN

## 2015-01-23 MED ORDER — AMLODIPINE BESYLATE 5 MG PO TABS
5.0000 mg | ORAL_TABLET | Freq: Every day | ORAL | Status: DC
  Administered 2015-01-23 – 2015-01-24 (×2): 5 mg via ORAL
  Filled 2015-01-23 (×2): qty 1

## 2015-01-23 MED ORDER — KCL IN DEXTROSE-NACL 20-5-0.9 MEQ/L-%-% IV SOLN
INTRAVENOUS | Status: DC
  Administered 2015-01-23 – 2015-01-24 (×2): via INTRAVENOUS
  Filled 2015-01-23 (×3): qty 1000

## 2015-01-23 MED ORDER — 0.9 % SODIUM CHLORIDE (POUR BTL) OPTIME
TOPICAL | Status: DC | PRN
  Administered 2015-01-23: 1000 mL

## 2015-01-23 MED ORDER — HYDROMORPHONE HCL 1 MG/ML IJ SOLN
1.0000 mg | INTRAMUSCULAR | Status: DC | PRN
Start: 2015-01-23 — End: 2015-01-24
  Administered 2015-01-23 – 2015-01-24 (×4): 1 mg via INTRAVENOUS
  Filled 2015-01-23 (×4): qty 1

## 2015-01-23 MED ORDER — FENTANYL CITRATE (PF) 100 MCG/2ML IJ SOLN
INTRAMUSCULAR | Status: DC | PRN
  Administered 2015-01-23: 50 ug via INTRAVENOUS
  Administered 2015-01-23: 100 ug via INTRAVENOUS
  Administered 2015-01-23 (×2): 50 ug via INTRAVENOUS

## 2015-01-23 MED ORDER — LIDOCAINE HCL (CARDIAC) 20 MG/ML IV SOLN
INTRAVENOUS | Status: DC | PRN
  Administered 2015-01-23: 100 mg via INTRAVENOUS

## 2015-01-23 MED ORDER — DIPHENHYDRAMINE HCL 50 MG/ML IJ SOLN: 12.5000 mg | Freq: Four times a day (QID) | INTRAMUSCULAR | Status: DC | PRN

## 2015-01-23 MED ORDER — METHOCARBAMOL 500 MG PO TABS
500.0000 mg | ORAL_TABLET | Freq: Four times a day (QID) | ORAL | Status: DC | PRN
  Administered 2015-01-24: 500 mg via ORAL
  Filled 2015-01-23: qty 1

## 2015-01-23 MED ORDER — IOHEXOL 300 MG/ML  SOLN
100.0000 mL | Freq: Once | INTRAMUSCULAR | Status: AC | PRN
Start: 2015-01-23 — End: 2015-01-23
  Administered 2015-01-23: 100 mL via INTRAVENOUS

## 2015-01-23 MED ORDER — PROMETHAZINE HCL 25 MG/ML IJ SOLN: 6.2500 mg | INTRAMUSCULAR | Status: DC | PRN

## 2015-01-23 MED ORDER — FENTANYL CITRATE (PF) 250 MCG/5ML IJ SOLN
INTRAMUSCULAR | Status: AC
  Filled 2015-01-23: qty 5

## 2015-01-23 MED ORDER — MIDAZOLAM HCL 5 MG/5ML IJ SOLN
INTRAMUSCULAR | Status: DC | PRN
  Administered 2015-01-23: 2 mg via INTRAVENOUS

## 2015-01-23 MED ORDER — ACETAMINOPHEN 325 MG PO TABS: 650.0000 mg | ORAL_TABLET | Freq: Four times a day (QID) | ORAL | Status: DC | PRN

## 2015-01-23 MED ORDER — LIDOCAINE HCL 2 % EX GEL
CUTANEOUS | Status: DC | PRN
  Administered 2015-01-23: 1

## 2015-01-23 MED ORDER — HYDROMORPHONE HCL 1 MG/ML IJ SOLN
0.2500 mg | INTRAMUSCULAR | Status: DC | PRN
  Administered 2015-01-23: 0.5 mg via INTRAVENOUS

## 2015-01-23 MED ORDER — HYDROMORPHONE HCL 1 MG/ML IJ SOLN
INTRAMUSCULAR | Status: AC
  Administered 2015-01-23: 0.5 mg via INTRAVENOUS
  Filled 2015-01-23: qty 2

## 2015-01-23 MED ORDER — ENOXAPARIN SODIUM 40 MG/0.4ML ~~LOC~~ SOLN
40.0000 mg | SUBCUTANEOUS | Status: DC
  Filled 2015-01-23: qty 0.4

## 2015-01-23 MED ORDER — MIDAZOLAM HCL 2 MG/2ML IJ SOLN
INTRAMUSCULAR | Status: AC
  Filled 2015-01-23: qty 2

## 2015-01-23 SURGICAL SUPPLY — 40 items
CANISTER SUCTION 2500CC (MISCELLANEOUS) ×3 IMPLANT
CLEANER TIP ELECTROSURG 2X2 (MISCELLANEOUS) ×3 IMPLANT
COVER SURGICAL LIGHT HANDLE (MISCELLANEOUS) ×3 IMPLANT
DRAPE UTILITY XL STRL (DRAPES) ×3 IMPLANT
DRSG PAD ABDOMINAL 8X10 ST (GAUZE/BANDAGES/DRESSINGS) ×3 IMPLANT
ELECT REM PT RETURN 9FT ADLT (ELECTROSURGICAL) ×3
ELECTRODE REM PT RTRN 9FT ADLT (ELECTROSURGICAL) ×1 IMPLANT
GAUZE SPONGE 4X4 12PLY STRL (GAUZE/BANDAGES/DRESSINGS) ×3 IMPLANT
GLOVE BIO SURGEON STRL SZ 6.5 (GLOVE) ×4 IMPLANT
GLOVE BIO SURGEON STRL SZ8 (GLOVE) ×3 IMPLANT
GLOVE BIO SURGEONS STRL SZ 6.5 (GLOVE) ×2
GLOVE BIOGEL PI IND STRL 6.5 (GLOVE) ×1 IMPLANT
GLOVE BIOGEL PI IND STRL 8 (GLOVE) ×1 IMPLANT
GLOVE BIOGEL PI INDICATOR 6.5 (GLOVE) ×2
GLOVE BIOGEL PI INDICATOR 8 (GLOVE) ×2
GOWN STRL REUS W/ TWL LRG LVL3 (GOWN DISPOSABLE) ×1 IMPLANT
GOWN STRL REUS W/ TWL XL LVL3 (GOWN DISPOSABLE) ×1 IMPLANT
GOWN STRL REUS W/TWL LRG LVL3 (GOWN DISPOSABLE) ×3
GOWN STRL REUS W/TWL XL LVL3 (GOWN DISPOSABLE) ×3
KIT BASIN OR (CUSTOM PROCEDURE TRAY) ×3 IMPLANT
KIT ROOM TURNOVER OR (KITS) ×3 IMPLANT
NEEDLE HYPO 25GX1X1/2 BEV (NEEDLE) ×3 IMPLANT
NEEDLE SPNL 18GX3.5 QUINCKE PK (NEEDLE) ×3 IMPLANT
NS IRRIG 1000ML POUR BTL (IV SOLUTION) ×3 IMPLANT
PACK LITHOTOMY IV (CUSTOM PROCEDURE TRAY) ×3 IMPLANT
PAD ABD 8X10 STRL (GAUZE/BANDAGES/DRESSINGS) ×3 IMPLANT
PAD ARMBOARD 7.5X6 YLW CONV (MISCELLANEOUS) ×6 IMPLANT
PENCIL BUTTON HOLSTER BLD 10FT (ELECTRODE) ×3 IMPLANT
SPONGE SURGIFOAM ABS GEL SZ50 (HEMOSTASIS) ×3 IMPLANT
SWAB COLLECTION DEVICE MRSA (MISCELLANEOUS) ×3 IMPLANT
SYR BULB 3OZ (MISCELLANEOUS) ×3 IMPLANT
SYR CONTROL 10ML LL (SYRINGE) ×3 IMPLANT
SYRINGE 10CC LL (SYRINGE) ×3 IMPLANT
TAPE CLOTH SURG 4X10 WHT LF (GAUZE/BANDAGES/DRESSINGS) ×3 IMPLANT
TOWEL OR 17X24 6PK STRL BLUE (TOWEL DISPOSABLE) ×3 IMPLANT
TOWEL OR 17X26 10 PK STRL BLUE (TOWEL DISPOSABLE) ×3 IMPLANT
TUBE CONNECTING 12'X1/4 (SUCTIONS) ×1
TUBE CONNECTING 12X1/4 (SUCTIONS) ×2 IMPLANT
UNDERPAD 30X30 INCONTINENT (UNDERPADS AND DIAPERS) ×3 IMPLANT
YANKAUER SUCT BULB TIP NO VENT (SUCTIONS) ×3 IMPLANT

## 2015-01-23 NOTE — H&P (Signed)
Eric Nielsen is an 54 y.o. male.   Chief Complaint: rectal pain HPI: Asked to see in ED by EDP for 1 day hx severe perirectal pain.  Has hx in 2014 of I/D of perirectal abscess.  Pt has had a dull ache in anal region for 5 days.  No fever or chills.  EDP did rectal exam and patient began to cry. Pain 10/10. No drainage .   Past Medical History  Diagnosis Date  . Arthritis   . HTN (hypertension)   . Abscess, perirectal     Past Surgical History  Procedure Laterality Date  . Incision and drainage perirectal abscess N/A 10/14/2012    Procedure: IRRIGATION AND DEBRIDEMENT PERIRECTAL ABSCESS;  Surgeon: Zenovia Jarred, MD;  Location: [facility];  Service: General;  Laterality: N/A;    Family History  Problem Relation Age of Onset  . Hypertension Mother   . Cancer Mother     Lung and breast and brain  . Heart failure Father   . Hypertension Father   . Hypertension Brother   . Diabetes Brother    Social History:  reports that he has been smoking Cigarettes.  He has been smoking about 0.50 packs per day. He has never used smokeless tobacco. He reports that he drinks about 12.6 oz of alcohol per week. He reports that he does not use illicit drugs.  Allergies: No Known Allergies   (Not in a hospital admission)  Results for orders placed or performed during the hospital encounter of 01/23/15 (from the past 48 hour(s))  Comprehensive metabolic panel     Status: Abnormal   Collection Time: 01/23/15  7:44 AM  Result Value Ref Range   Sodium 134 (L) 135 - 145 mmol/L   Potassium 3.8 3.5 - 5.1 mmol/L   Chloride 99 (L) 101 - 111 mmol/L   CO2 25 22 - 32 mmol/L   Glucose, Bld 137 (H) 65 - 99 mg/dL   BUN 14 6 - 20 mg/dL   Creatinine, Ser 0.99 0.61 - 1.24 mg/dL   Calcium 9.5 8.9 - 10.3 mg/dL   Total Protein 8.3 (H) 6.5 - 8.1 g/dL   Albumin 4.1 3.5 - 5.0 g/dL   AST 48 (H) 15 - 41 U/L   ALT 40 17 - 63 U/L   Alkaline Phosphatase 74 38 - 126 U/L   Total Bilirubin 0.7 0.3 - 1.2 mg/dL   GFR calc  non Af Amer >60 >60 mL/min   GFR calc Af Amer >60 >60 mL/min    Comment: (NOTE) The eGFR has been calculated using the CKD EPI equation. This calculation has not been validated in all clinical situations. eGFR's persistently <60 mL/min signify possible Chronic Kidney Disease.    Anion gap 10 5 - 15  CBC with Differential     Status: Abnormal   Collection Time: 01/23/15  7:44 AM  Result Value Ref Range   WBC 5.1 4.0 - 10.5 K/uL   RBC 5.27 4.22 - 5.81 MIL/uL   Hemoglobin 17.6 (H) 13.0 - 17.0 g/dL   HCT 48.6 39.0 - 52.0 %   MCV 92.2 78.0 - 100.0 fL   MCH 33.4 26.0 - 34.0 pg   MCHC 36.2 (H) 30.0 - 36.0 g/dL   RDW 12.5 11.5 - 15.5 %   Platelets 120 (L) 150 - 400 K/uL   Neutrophils Relative % 48 %   Neutro Abs 2.5 1.7 - 7.7 K/uL   Lymphocytes Relative 37 %   Lymphs Abs 1.9 0.7 -  4.0 K/uL   Monocytes Relative 14 %   Monocytes Absolute 0.7 0.1 - 1.0 K/uL   Eosinophils Relative 1 %   Eosinophils Absolute 0.0 0.0 - 0.7 K/uL   Basophils Relative 0 %   Basophils Absolute 0.0 0.0 - 0.1 K/uL   No results found.  Review of Systems  Constitutional: Negative for fever and chills.  HENT: Negative.   Eyes: Negative.   Respiratory: Negative.   Cardiovascular: Negative.   Gastrointestinal: Negative for abdominal pain, constipation and blood in stool.  Genitourinary: Negative.   Skin: Negative.   Neurological: Negative.   Psychiatric/Behavioral: Negative.     Blood pressure 153/89, pulse 77, temperature 98.5 F (36.9 C), temperature source Oral, resp. rate 18, height $RemoveBe'5\' 11"'[id number]$  (1.803 m), weight 87.998 kg (194 lb), SpO2 99 %. Physical Exam  Constitutional: He appears well-developed and well-nourished.  HENT:  Head: Normocephalic and atraumatic.  Eyes: Pupils are equal, round, and reactive to light.  Neck: Neck supple.  Cardiovascular: Normal rate.   Respiratory: Effort normal. He has no wheezes. He has no rales.  GI: Soft. He exhibits no distension. There is no tenderness.    Genitourinary: Penis normal.     Musculoskeletal: Normal range of motion.  Neurological: He is alert.  Skin: Skin is warm.  Psychiatric: He has a normal mood and affect. His behavior is normal. Judgment and thought content normal.     Assessment/Plan Proctalgia and probable perirectal abscess Recommend EUA/drainage of abscess in the OR. The procedure has been discussed with the patient.  Alternative therapies have been discussed with the patient.  Operative risks include bleeding,  Infection,  Organ injury,  Fecal incontinence,  Prostate urethra  Injury,   Nerve injury,  Blood vessel injury,  DVT,  Pulmonary embolism,  Death,  And possible reoperation.  Medical management risks include worsening of present situation.  The success of the procedure is 50 -90 % at treating patients symptoms.  The patient understands and agrees to proceed.  Lilybeth Vien A. 01/23/2015, 9:48 AM

## 2015-01-23 NOTE — ED Notes (Signed)
General surgery at bedside. 

## 2015-01-23 NOTE — Anesthesia Postprocedure Evaluation (Signed)
Anesthesia Post Note  Patient: Eric Nielsen  Procedure(s) Performed: Procedure(s) (LRB): EXAM UNDER ANESTHESIA (N/A)  Anesthesia type: General  Patient location: PACU  Post pain: Pain level controlled  Post assessment: Post-op Vital signs reviewed  Last Vitals: BP 171/109 mmHg  Pulse 84  Temp(Src) 36.6 C (Oral)  Resp 18  Ht 5\' 11"  (1.803 m)  Wt 194 lb (87.998 kg)  BMI 27.07 kg/m2  SpO2 97%  Post vital signs: Reviewed  Level of consciousness: sedated  Complications: No apparent anesthesia complications

## 2015-01-23 NOTE — Assessment & Plan Note (Signed)
Patient did very well following injection in 05/2014. Pain hadn't returned until earlier this week.  Not doing exercises. Exam again consistent with trochanteric bursitis. Injection performed and patient reported significant pain relief following injection.  - Red flags advised - f/u PRN

## 2015-01-23 NOTE — Progress Notes (Signed)
Dr. Lissa Hoard updated on pt BP after 10 mg Labetalol IV in PACU.Marland Kitchen Pain level <5/10. Cleared to transfer to 6 N

## 2015-01-23 NOTE — Anesthesia Procedure Notes (Signed)
Procedure Name: LMA Insertion Date/Time: 01/23/2015 1:55 PM Performed by: Eligha Bridegroom Pre-anesthesia Checklist: Patient identified, Emergency Drugs available, Timeout performed, Patient being monitored and Suction available Patient Re-evaluated:Patient Re-evaluated prior to inductionOxygen Delivery Method: Circle system utilized Preoxygenation: Pre-oxygenation with 100% oxygen Intubation Type: IV induction Ventilation: Mask ventilation without difficulty LMA: LMA with gastric port inserted LMA Size: 4.5 Placement Confirmation: positive ETCO2 and breath sounds checked- equal and bilateral Tube secured with: Tape Dental Injury: Teeth and Oropharynx as per pre-operative assessment

## 2015-01-23 NOTE — Progress Notes (Signed)
  Eric Nielsen - 54 y.o. male MRN [id number]  Date of birth: 1960-11-04  CC: Right hip pain  SUBJECTIVE:   HPI  Recurrent right hip pain: - Pain resolved following injection on 06/01/2014 until several weeks ago.  - 1 week of worsening pain - Occasional calf pain - Denies any pain radiating down either leg.  - No low back pain - No groin or gluteal pain. Entirely lateral.  - No recent trauma    ROS:     8 point RoS negative other than that listed in HPI above. Denies fevers chills or night sweats.   HISTORY: Past Medical, Surgical, Social, and Family History Reviewed & Updated per EMR.    OBJECTIVE: BP 145/90 mmHg  Pulse 68  Ht 5\' 11"  (1.803 m)  Wt 197 lb (89.359 kg)  BMI 27.49 kg/m2  Physical Exam  Calm, NAD. Uncomfortable with palpation of right greater troch Non-labored breathing  ROM IR: 80 Deg with minimal lateral hip pain, ER: 80 Deg, Flexion: 120 Deg, Extension: 100 Deg, Abduction: 45 Deg, Adduction: 45 Deg Strength 5/5 throughout lower extremity Pelvic alignment unremarkable to inspection and palpation. Greater trochanter with tenderness to palpation. No tenderness over piriformis. No SI joint tenderness and normal minimal SI movement.  Negative SLR sitting DTR + 2/4, DP/PT +2/4 B/L    MEDICATIONS, LABS & OTHER ORDERS: Previous Medications   AMLODIPINE (NORVASC) 5 MG TABLET    Take 1 tablet (5 mg total) by mouth daily.   LISINOPRIL (PRINIVIL,ZESTRIL) 40 MG TABLET    Take 1 tablet (40 mg total) by mouth daily.   Modified Medications   No medications on file   New Prescriptions   No medications on file   Discontinued Medications   No medications on file  No orders of the defined types were placed in this encounter.   ASSESSMENT & PLAN: Consent obtained and verified. Sterile betadine prep. Furthur cleansed with alcohol. Topical analgesic spray: Ethyl chloride. Joint: Right greater trochanter Approached in typical fashion with: Completed without  difficulty Meds: 80mg /ml of methylpred and 3 cc of 1% xylocaine, 1:3 Needle: Spinal, 25 g Aftercare instructions and Red flags advised. Patient reported signicant pain relief following injection.  See problem based charting & AVS for pt instructions.

## 2015-01-23 NOTE — Op Note (Signed)
Preoperative diagnosis: Severe proctalgia  Postoperative diagnosis: Same plus grade 2 internal hemorrhoid disease involving all 3 columns without thrombosis and no external hemorrhoid disease noted  Procedure: Exam under anesthesia  Surgeon: Erroll Luna M.D.  Anesthesia LMA with 0.25% Sensorcaine local with epinephrine  EBL: Minimal  Specimen: None  Indications for procedure: Patient is a 54 year old male with a history of perirectal abscess in 2014 status postdrainage presents with the sudden onset of right severe perianal pain. This started last night. It came on suddenly. Denies any fever or chills or drainage from his rectum. He was evaluated emergency room and hence severe right-sided tenderness to the right anal canal. He did not tolerate rectal examination and all. Concern was for possible perirectal abscess was brought to the operating room for exam under anesthesia and further evaluation. Risk of bleeding, infection, organ injury, need for other surgical procedures, further workup, injury to the prostate, injury to nerves, injury to blood vessels, infection, death,  discussed with the patient. He agreed to proceed. Alternatives to surgery discussed.  Description of procedure: The patient was met in the holding area. Questions are answered and procedure reviewed. He is taken back to the operative room placed upon the OR table. After induction of general anesthesia, his placed lithotomy in the perianal region and perineum were prepped and draped in a sterile fashion. Timeout was done. Digital examination revealed some fullness along the right side of the anal canal. Anoscope was placed and evaluation showed grade 23 column internal hemorrhoid disease. There was some inflammation associated with all 3 columns but no thrombosis. The fullness in the right anal canal was evaluated by aspiration using the 18-gauge needle. Area felt fluctuant but I could not aspirate any pus. Anal block was  placed 0.25% Sensorcaine local with epinephrine. I made a small incision in the fluctuant area along the right anal canal of approximately 1 cm. Hotspot noted in the superior drainage. By palpation and visual examination could not see any other abnormality to explain his pain except as hemorrhoid disease. A small area related posterior midline anal fissure that could explain his. I palpated his prostate gland felt normal. Aspiration was all done along the right anal canal. This point time lidocaine jelly was placed in the anal canal for postoperative pain relief. A small piece of Gelfoam packing was placed the anal canal for hemostasis. I saw no other expiration force pain this point in time we'll obtain a postoperative pelvic CT scan to exclude an abscess above the pelvic floor. All counts are found to be correct. The patient was taken to lithotomy extubated taken to recovery in satisfactory condition. EBL less than 20 mL.

## 2015-01-23 NOTE — Transfer of Care (Signed)
Immediate Anesthesia Transfer of Care Note  Patient: Eric Nielsen  Procedure(s) Performed: Procedure(s): EXAM UNDER ANESTHESIA (N/A)  Patient Location: PACU  Anesthesia Type:General  Level of Consciousness: awake, alert  and oriented  Airway & Oxygen Therapy: Patient Spontanous Breathing  Post-op Assessment: Report given to RN and Post -op Vital signs reviewed and stable  Post vital signs: Reviewed and stable  Last Vitals:  Filed Vitals:   01/23/15 1242  BP: 152/90  Pulse:   Temp: 37.2 C  Resp: 18    Complications: No apparent anesthesia complications

## 2015-01-23 NOTE — ED Notes (Signed)
PT states he is here for rectal pain, constipation and pain at cortisone injection site.  Received cortisone shot to R hip yesterday for "bursitis" and pt is experiencing pain at injection site.  Also state he is constipated ( last BM yesterday).  He is worried he has another abscess in his rectum b/c the pain is the same.

## 2015-01-23 NOTE — Anesthesia Preprocedure Evaluation (Signed)
Anesthesia Evaluation  Patient identified by MRN, date of birth, ID band Patient awake    Reviewed: Allergy & Precautions, H&P , NPO status , Patient's Chart, lab work & pertinent test results  Airway Mallampati: II  TM Distance: >3 FB Neck ROM: Full    Dental no notable dental hx. (+) Edentulous Upper, Edentulous Lower, Dental Advisory Given   Pulmonary Current Smoker,    Pulmonary exam normal breath sounds clear to auscultation       Cardiovascular hypertension, Pt. on medications  Rhythm:Regular Rate:Normal     Neuro/Psych negative neurological ROS  negative psych ROS   GI/Hepatic negative GI ROS, Neg liver ROS, Perirectal abscess    Endo/Other  negative endocrine ROS  Renal/GU negative Renal ROS     Musculoskeletal  (+) Arthritis , Osteoarthritis,    Abdominal   Peds  Hematology negative hematology ROS (+)   Anesthesia Other Findings   Reproductive/Obstetrics negative OB ROS                             Anesthesia Physical  Anesthesia Plan  ASA: II  Anesthesia Plan: General   Post-op Pain Management:    Induction: Intravenous  Airway Management Planned: Oral ETT  Additional Equipment:   Intra-op Plan:   Post-operative Plan: Extubation in OR  Informed Consent: I have reviewed the patients History and Physical, chart, labs and discussed the procedure including the risks, benefits and alternatives for the proposed anesthesia with the patient or authorized representative who has indicated his/her understanding and acceptance.   Dental advisory given  Plan Discussed with: CRNA  Anesthesia Plan Comments:         Anesthesia Quick Evaluation

## 2015-01-23 NOTE — ED Provider Notes (Signed)
CSN: [id number]     Arrival date & time 01/23/15  3154 History   First MD Initiated Contact with Patient 01/23/15 [phone]     Chief Complaint  Patient presents with  . Rectal Pain  . Hip Pain     (Consider location/radiation/quality/duration/timing/severity/associated sxs/prior Treatment) Patient is a 54 y.o. male presenting with hip pain.  Hip Pain This is a new problem. The current episode started yesterday. The problem occurs constantly. The problem has been gradually improving. Pertinent negatives include no chest pain, no abdominal pain, no headaches and no shortness of breath. The symptoms are aggravated by walking. Nothing relieves the symptoms.    Past Medical History  Diagnosis Date  . Arthritis   . HTN (hypertension)   . Abscess, perirectal    Past Surgical History  Procedure Laterality Date  . Incision and drainage perirectal abscess N/A 10/14/2012    Procedure: IRRIGATION AND DEBRIDEMENT PERIRECTAL ABSCESS;  Surgeon: Zenovia Jarred, MD;  Location: [facility];  Service: General;  Laterality: N/A;   Family History  Problem Relation Age of Onset  . Hypertension Mother   . Cancer Mother     Lung and breast and brain  . Heart failure Father   . Hypertension Father   . Hypertension Brother   . Diabetes Brother    Social History  Substance Use Topics  . Smoking status: Current Every Day Smoker -- 0.50 packs/day    Types: Cigarettes  . Smokeless tobacco: Never Used  . Alcohol Use: 12.6 oz/week    21 Cans of beer per week    Review of Systems  Respiratory: Negative for shortness of breath.   Cardiovascular: Negative for chest pain.  Gastrointestinal: Negative for abdominal pain.  Neurological: Negative for headaches.  All other systems reviewed and are negative.     Allergies  Review of patient's allergies indicates no known allergies.  Home Medications   Prior to Admission medications   Medication Sig Start Date End Date Taking? Authorizing Provider   lisinopril (PRINIVIL,ZESTRIL) 40 MG tablet Take 1 tablet (40 mg total) by mouth daily. 01/15/15  Yes Ashly M Gottschalk, DO  amLODipine (NORVASC) 5 MG tablet Take 1 tablet (5 mg total) by mouth daily. Patient not taking: Reported on 01/23/2015 01/15/15   Ashly M Gottschalk, DO   BP 150/77 mmHg  Pulse 58  Temp(Src) 98.6 F (37 C) (Oral)  Resp 18  Ht 5\' 11"  (1.803 m)  Wt 194 lb (87.998 kg)  BMI 27.07 kg/m2  SpO2 97% Physical Exam  Constitutional: He is oriented to person, place, and time. He appears well-developed and well-nourished.  HENT:  Head: Normocephalic and atraumatic.  Eyes: Conjunctivae and EOM are normal.  Neck: Normal range of motion. Neck supple.  Cardiovascular: Normal rate, regular rhythm and normal heart sounds.   Pulmonary/Chest: Effort normal and breath sounds normal. No respiratory distress.  Abdominal: He exhibits no distension. There is no tenderness. There is no rebound and no guarding.  Genitourinary:  exquisite tenderness at internal R rectum with 2 cm fluctuance  Musculoskeletal: Normal range of motion.       Lumbar back: Normal.  Neurological: He is alert and oriented to person, place, and time.  Skin: Skin is warm and dry.  Vitals reviewed.   ED Course  Procedures (including critical care time) Labs Review Labs Reviewed  COMPREHENSIVE METABOLIC PANEL - Abnormal; Notable for the following:    Sodium 134 (*)    Chloride 99 (*)    Glucose, Bld 137 (*)  Total Protein 8.3 (*)    AST 48 (*)    All other components within normal limits  CBC WITH DIFFERENTIAL/PLATELET - Abnormal; Notable for the following:    Hemoglobin 17.6 (*)    MCHC 36.2 (*)    Platelets 120 (*)    All other components within normal limits    Imaging Review Ct Abdomen Pelvis W Contrast  01/23/2015  CLINICAL DATA:  Sudden onset of severe perirectal pain. Remote history of perirectal abscess. EXAM: CT ABDOMEN AND PELVIS WITH CONTRAST TECHNIQUE: Multidetector CT imaging of the  abdomen and pelvis was performed using the standard protocol following bolus administration of intravenous contrast. CONTRAST:  190mL OMNIPAQUE IOHEXOL 300 MG/ML  SOLN COMPARISON:  CT scan 10/14/2012 FINDINGS: Lower chest: The lung bases are clear. No pleural effusion or worrisome pulmonary lesion. The heart is normal in size. No pericardial effusion. Hepatobiliary: Diffuse marked fatty infiltration of the liver without focal hepatic lesion or intrahepatic biliary dilatation. The gallbladder is grossly normal. No common bile duct dilatation. Pancreas: No mass, inflammation or ductal dilatation. Spleen: Normal size.  No focal lesions. Adrenals/Urinary Tract: The adrenal glands and kidneys are normal except for left renal cyst. No hydronephrosis. No renal or obstructing ureteral calculi or bladder calculi. The bladder is moderately distended. Stomach/Bowel: The stomach, duodenum, small bowel and colon are unremarkable. No inflammatory changes, mass lesions or obstructive findings the terminal ileum is normal. There is diffuse low rectal and anal area soft tissue swelling circumferentially. Area of slightly more pronounced low-attenuation in the 6 o'clock position of the anus. No obvious abscess. If a perianal fistula is a clinical concern MR without and with contrast is suggested. Vascular/Lymphatic: No mesenteric or retroperitoneal mass or adenopathy. Small scattered lymph nodes are noted. The aorta is normal in caliber. Moderate atherosclerotic calcifications involving the aorta and iliac arteries. The branch vessels are patent. The major venous structures are patent. Other: Moderate distention of the bladder. No pelvic mass or adenopathy. No free pelvic fluid collections. No pelvic abscess. Perirectal process. The ischiorectal fossa is clear. No inguinal mass or adenopathy. Musculoskeletal: No significant bony findings. IMPRESSION: 1. Diffuse low rectal and anal wall thickening/edema circumferentially. No obvious  perianal fistula or abscess. MR may be helpful for further evaluation clinically indicated. 2. No perirectal or pelvic abscess. 3. Moderate distention of the bladder. 4. Diffuse fatty infiltration of the liver. 5. Advanced aortoiliac and coronary artery calcifications for age. Electronically Signed   By: Marijo Sanes M.D.   On: 01/23/2015 20:40   I have personally reviewed and evaluated these images and lab results as part of my medical decision-making.   EKG Interpretation None      MDM   Final diagnoses:  Proctalgia    54 y.o. male with pertinent PMH of prior rectal abscess, HTN presents with symptoms idential to prior perirectal abscess.  Physical exam with concern for rectal abscess.  Consulted surgery and pt taken to OR. I have reviewed all laboratory and imaging studies if ordered as above.    1. Proctalgia         Debby Freiberg, MD 01/24/15 [phone]

## 2015-01-23 NOTE — Interval H&P Note (Signed)
History and Physical Interval Note:  01/23/2015 11:42 AM  Eric Nielsen  has presented today for surgery, with the diagnosis of buttock abscess  The various methods of treatment have been discussed with the patient and family. After consideration of risks, benefits and other options for treatment, the patient has consented to  Procedure(s): IRRIGATION AND DEBRIDEMENT BUTTOCKS ABSCESS (N/A) as a surgical intervention .  The patient's history has been reviewed, patient examined, no change in status, stable for surgery.  I have reviewed the patient's chart and labs.  Questions were answered to the patient's satisfaction.     [name] A.

## 2015-01-23 NOTE — ED Notes (Signed)
Called OR they will take pt in 20 min.

## 2015-01-24 NOTE — Discharge Instructions (Signed)
-  see above 

## 2015-01-24 NOTE — Progress Notes (Addendum)
Discharge instructions gone over with patient. Home medications gone over. Follow up appointment is to be made. Diet, activity, and reasons to call the doctor gone over. Patient was sent home with sitz bath container and instructed how to use. Patient verbalized understanding of instructions.

## 2015-01-24 NOTE — Discharge Summary (Signed)
  Patient ID: Eric Nielsen [id number] 54 y.o. 14-Mar-1961  Admit date: 01/23/2015  Discharge date and time: 01/24/2015  Admitting Physician: Erroll Luna  Discharge Physician: Adin Hector  Admission Diagnoses: Hip pain buttock abscess  Discharge Diagnoses: Rectal pain, grade 2 internal hemorrhoids  Operations: Procedure(s): EXAM UNDER ANESTHESIA  Admission Condition: good  Discharged Condition: good  Indication for Admission: Patient is a 54 year old male with a history of perirectal abscess in 2014 status postdrainage presents with the sudden onset of right severe perianal pain. This started last night. It came on suddenly. Denies any fever or chills or drainage from his rectum. He was evaluated emergency room and hence severe right-sided tenderness to the right anal canal. He did not tolerate rectal examination at all. Concern was for possible perirectal abscess was brought to the operating room for exam under anesthesia and further evaluation.  Hospital Course: The patient was evaluated in the emergency department by Dr. Brantley Stage.  The patient was taken to the operating room and underwent examination under anesthesia.  There was no evidence of internal or external abscess.  Some internal hemorrhoids were noted but there was no inflammatory condition.  See operative note for details    Postoperatively the patient underwent CT scanning of the abdomen and pelvis.  There were no dramatically abnormal findings.  There was a little bit of rectal wall thickening possibly postop change but no fistula or abscess.     Postoperatively the patient did well.  On postop day 1 he said his pain was much better and he felt good.  He is voiding.  He was ready to go home.  We decided that we would avoid the use of narcotics because of the potential for constipation.  He was told to use Tylenol or ibuprofen for pain.  Avoiding constipation was stressed.  Low-fat high-fiber diet was advised.  Metamucil  twice a day.  Forced hydration was stressed.  No restriction of activities.  He was asked to return to the office in 3 weeks.  Consults: None  Significant Diagnostic Studies: Lab work.  Examination under anesthesia.  CT abdomen and pelvis.  Treatments: surgery: Examination under anesthesia  Disposition: Home  Patient Instructions:    Medication List    TAKE these medications        amLODipine 5 MG tablet  Commonly known as:  NORVASC  Take 1 tablet (5 mg total) by mouth daily.     lisinopril 40 MG tablet  Commonly known as:  PRINIVIL,ZESTRIL  Take 1 tablet (40 mg total) by mouth daily.        Activity: activity as tolerated Diet: low fat, low cholesterol diet Wound Care: none needed  Follow-up:  With [facility] surgery  in 3 weeks.  Signed: Edsel Petrin. Dalbert Batman, M.D., FACS General and minimally invasive surgery Breast and Colorectal Surgery  01/24/2015, 8:55 AM

## 2015-01-25 ENCOUNTER — Encounter (HOSPITAL_COMMUNITY): Payer: Self-pay | Admitting: Surgery

## 2015-01-25 ENCOUNTER — Encounter: Payer: Self-pay | Admitting: Family Medicine

## 2015-01-27 ENCOUNTER — Ambulatory Visit: Payer: 59 | Admitting: Family Medicine

## 2015-03-16 ENCOUNTER — Other Ambulatory Visit: Payer: Self-pay | Admitting: Orthopedic Surgery

## 2015-03-24 ENCOUNTER — Inpatient Hospital Stay (HOSPITAL_COMMUNITY): Admission: RE | Admit: 2015-03-24 | Payer: 59 | Source: Ambulatory Visit | Admitting: Orthopedic Surgery

## 2015-03-24 ENCOUNTER — Encounter (HOSPITAL_COMMUNITY): Admission: RE | Payer: Self-pay | Source: Ambulatory Visit

## 2015-03-24 SURGERY — POSTERIOR LUMBAR FUSION 2 LEVEL
Anesthesia: General | Laterality: Right

## 2015-04-09 ENCOUNTER — Encounter: Payer: Self-pay | Admitting: Family Medicine

## 2015-04-09 ENCOUNTER — Telehealth: Payer: Self-pay | Admitting: *Deleted

## 2015-04-09 ENCOUNTER — Ambulatory Visit (INDEPENDENT_AMBULATORY_CARE_PROVIDER_SITE_OTHER): Payer: 59 | Admitting: Family Medicine

## 2015-04-09 VITALS — BP 172/98 | HR 89 | Temp 98.7°F | Ht 71.0 in | Wt 199.8 lb

## 2015-04-09 DIAGNOSIS — K648 Other hemorrhoids: Secondary | ICD-10-CM | POA: Diagnosis not present

## 2015-04-09 DIAGNOSIS — I10 Essential (primary) hypertension: Secondary | ICD-10-CM

## 2015-04-09 DIAGNOSIS — F17200 Nicotine dependence, unspecified, uncomplicated: Secondary | ICD-10-CM

## 2015-04-09 DIAGNOSIS — K644 Residual hemorrhoidal skin tags: Secondary | ICD-10-CM | POA: Insufficient documentation

## 2015-04-09 MED ORDER — NICOTINE 14 MG/24HR TD PT24: 14.0000 mg | MEDICATED_PATCH | Freq: Every day | TRANSDERMAL | Status: DC

## 2015-04-09 NOTE — Patient Instructions (Signed)
1800QUITNOW: [place] quitline.  They often will give you patches for free.  Schedule an appointment with Dr Valentina Lucks for continued help with smoking cessation. Steps to Quit Smoking  Smoking tobacco can be harmful to your health and can affect almost every organ in your body. Smoking puts you, and those around you, at risk for developing many serious chronic diseases. Quitting smoking is difficult, but it is one of the best things that you can do for your health. It is never too late to quit. WHAT ARE THE BENEFITS OF QUITTING SMOKING? When you quit smoking, you lower your risk of developing serious diseases and conditions, such as:  Lung cancer or lung disease, such as COPD.  Heart disease.  Stroke.  Heart attack.  Infertility.  Osteoporosis and bone fractures. Additionally, symptoms such as coughing, wheezing, and shortness of breath may get better when you quit. You may also find that you get sick less often because your body is stronger at fighting off colds and infections. If you are pregnant, quitting smoking can help to reduce your chances of having a baby of low birth weight. HOW DO I GET READY TO QUIT? When you decide to quit smoking, create a plan to make sure that you are successful. Before you quit:  Pick a date to quit. Set a date within the next two weeks to give you time to prepare.  Write down the reasons why you are quitting. Keep this list in places where you will see it often, such as on your bathroom mirror or in your car or wallet.  Identify the people, places, things, and activities that make you want to smoke (triggers) and avoid them. Make sure to take these actions:  Throw away all cigarettes at home, at work, and in your car.  Throw away smoking accessories, such as Scientist, research (medical).  Clean your car and make sure to empty the ashtray.  Clean your home, including curtains and carpets.  Tell your family, friends, and coworkers that you are quitting. Support from  your loved ones can make quitting easier.  Talk with your health care provider about your options for quitting smoking.  Find out what treatment options are covered by your health insurance. WHAT STRATEGIES CAN I USE TO QUIT SMOKING?  Talk with your healthcare provider about different strategies to quit smoking. Some strategies include:  Quitting smoking altogether instead of gradually lessening how much you smoke over a period of time. Research shows that quitting "cold [place]" is more successful than gradually quitting.  Attending in-person counseling to help you build problem-solving skills. You are more likely to have success in quitting if you attend several counseling sessions. Even short sessions of 10 minutes can be effective.  Finding resources and support systems that can help you to quit smoking and remain smoke-free after you quit. These resources are most helpful when you use them often. They can include:  Online chats with a Social worker.  Telephone quitlines.  Printed Furniture conservator/restorer.  Support groups or group counseling.  Text messaging programs.  Mobile phone applications.  Taking medicines to help you quit smoking. (If you are pregnant or breastfeeding, talk with your health care provider first.) Some medicines contain nicotine and some do not. Both types of medicines help with cravings, but the medicines that include nicotine help to relieve withdrawal symptoms. Your health care provider may recommend:  Nicotine patches, gum, or lozenges.  Nicotine inhalers or sprays.  Non-nicotine medicine that is taken by mouth. Talk  with your health care provider about combining strategies, such as taking medicines while you are also receiving in-person counseling. Using these two strategies together makes you more likely to succeed in quitting than if you used either strategy on its own. If you are pregnant or breastfeeding, talk with your health care provider about finding  counseling or other support strategies to quit smoking. Do not take medicine to help you quit smoking unless told to do so by your health care provider. WHAT THINGS CAN I DO TO MAKE IT EASIER TO QUIT? Quitting smoking might feel overwhelming at first, but there is a lot that you can do to make it easier. Take these important actions:  Reach out to your family and friends and ask that they support and encourage you during this time. Call telephone quitlines, reach out to support groups, or work with a counselor for support.  Ask people who smoke to avoid smoking around you.  Avoid places that trigger you to smoke, such as bars, parties, or smoke-break areas at work.  Spend time around people who do not smoke.  Lessen stress in your life, because stress can be a smoking trigger for some people. To lessen stress, try:  Exercising regularly.  Deep-breathing exercises.  Yoga.  Meditating.  Performing a body scan. This involves closing your eyes, scanning your body from head to toe, and noticing which parts of your body are particularly tense. Purposefully relax the muscles in those areas.  Download or purchase mobile phone or tablet apps (applications) that can help you stick to your quit plan by providing reminders, tips, and encouragement. There are many free apps, such as QuitGuide from the [employer] Office manager for Disease Control and Prevention). You can find other support for quitting smoking (smoking cessation) through smokefree.gov and other websites. HOW WILL I FEEL WHEN I QUIT SMOKING? Within the first 24 hours of quitting smoking, you may start to feel some withdrawal symptoms. These symptoms are usually most noticeable 2-3 days after quitting, but they usually do not last beyond 2-3 weeks. Changes or symptoms that you might experience include:  Mood swings.  Restlessness, anxiety, or irritation.  Difficulty concentrating.  Dizziness.  Strong cravings for sugary foods in addition to  nicotine.  Mild weight gain.  Constipation.  Nausea.  Coughing or a sore throat.  Changes in how your medicines work in your body.  A depressed mood.  Difficulty sleeping (insomnia). After the first 2-3 weeks of quitting, you may start to notice more positive results, such as:  Improved sense of smell and taste.  Decreased coughing and sore throat.  Slower heart rate.  Lower blood pressure.  Clearer skin.  The ability to breathe more easily.  Fewer sick days. Quitting smoking is very challenging for most people. Do not get discouraged if you are not successful the first time. Some people need to make many attempts to quit before they achieve long-term success. Do your best to stick to your quit plan, and talk with your health care provider if you have any questions or concerns.   This information is not intended to replace advice given to you by your health care provider. Make sure you discuss any questions you have with your health care provider.   Document Released: 02/28/2001 Document Revised: 07/21/2014 Document Reviewed: 07/21/2014 Elsevier Interactive Patient Education [employer].

## 2015-04-09 NOTE — Telephone Encounter (Signed)
Prior Authorization received from [employer] for Nicotine Patch.  PA form placed in provider box for completion. Derl Barrow, RN

## 2015-04-09 NOTE — Progress Notes (Signed)
Subjective: CC: HTN HPI: Eric Nielsen is a 55 y.o. male presenting to clinic today for office visit. Concerns today include:  1. Hypertension Blood pressure at home: does not check at home but does occ check at pharmacy. 140/70-80s Blood pressure today: 188/93 Meds: Compliant with Lisinopril.  Doesn't miss any doses.  Has NOT been taking Norvasc.  Reports that he did not realize he was to continue this medication. Side effects: none ROS: Denies headache, dizziness, visual changes, nausea, vomiting, chest pain, abdominal pain or shortness of breath. Patient reports that he is eating more lean meats and is incorporating at least 3 fruits daily.  He reports he is eating greens with each meal.  2.  Hemorrhoid. Reports that he never followed up with [facility] after his surgery.  He reports that he was told not to take opioids.  He has been taking stool softeners that help keep his BM regular.  Denies any rectal pain, hematochezia, melena.  3. Tobacco use d/o Smokes 1/2 ppd for 15 years.  Smokes [place] Menthols.  No chewing tobacco.  Is ready to quit.  He reports that he quit before for about 1 year but then lost his job in 2014 and started back again.  He quit the first time after being hospitalized.  He has never used medications or patches to help stop smoking.  His wife recently quit cold [place] and has been "on him" about quitting.  He recognizes that cessation would improve his health, citing that he has a family h/o lung cancer.  Endorses smokers cough, no fevers, CP, SOB, wheeze.  Reports that he wants to quit by Monday.  We discussed more realistic goals and he wants to shoot from a month out.  Social History Reviewed: active smoker 1/2 ppd . FamHx and MedHx reviewed.  Please see EMR. Health Maintenance: Colonoscopy due.  ROS: Per HPI  Objective: Office vital signs reviewed. BP 188/93 mmHg  Pulse 89  Temp(Src) 98.7 F (37.1 C) (Oral)  Ht 5\' 11"  (1.803 m)  Wt 199 lb  12.8 oz (90.629 kg)  BMI 27.88 kg/m2  Physical Examination:  General: Awake, alert, well nourished, No acute distress HEENT: Normal, MMM, sclera white Cardio: regular rate and rhythm, S1S2 heard, no murmurs appreciated Pulm: clear to auscultation bilaterally with prolonged expiratory phase, no wheezes, rhonchi or rales, normal work of breathing on room air Extremities: warm, well perfused, No edema, cyanosis or clubbing; +2 radial pulses bilaterally MSK: Normal gait and station  Assessment/ Plan: 55 y.o. male   1. HTN, goal below 140/90, uncontrolled. Initially elevated to 188/93.  Repeat 172/98.  NOT taking Norvasc.  Reports that he did not realize he was to continue this medication.  Continues to smoke.  See tobacco use d/o below. - Reviewed patient's medications with him - Patient to obtain Lisinopril and Norvasc from pharmacy - No red flags on exam - Follow up in 1 month for blood pressure check.  2. Tobacco use disorder. Action phase. Quit date 05/10/2015. - Smoking cessation counseling today. - nicotine (NICODERM CQ) 14 mg/24hr patch; Place 1 patch (14 mg total) onto the skin daily.  Dispense: 28 patch; Refill: 1.  Though suspect he will be ready to decrease to 7 mg patches in 1 month. - Patient to schedule appt with Dr Valentina Lucks.   - [place] quitline info provided.  3. External hemorrhoid. No post surgical complications. - Discussed continuing healthy food choices.  Maintain high fiber low fat diet.   -  Avoid opioid medications - Hydration encouraged - Follow up prn   Janora Norlander, DO PGY-2, [place]

## 2015-04-10 NOTE — Telephone Encounter (Signed)
Done

## 2015-04-12 NOTE — Telephone Encounter (Signed)
PA for Nicotine transdermal system step 2 approved via [employer] until 04/11/2016.  Reference number [id number].  Derl Barrow, RN

## 2015-04-12 NOTE — Telephone Encounter (Signed)
PA faxed to [employer] for review.  Review process takes up to 24-72 hours to complete.  Derl Barrow, RN

## 2015-04-23 ENCOUNTER — Ambulatory Visit: Payer: 59 | Admitting: Pharmacist

## 2015-05-06 ENCOUNTER — Other Ambulatory Visit: Payer: Self-pay | Admitting: Family Medicine

## 2015-05-07 ENCOUNTER — Ambulatory Visit: Payer: 59 | Admitting: Pharmacist

## 2015-05-21 ENCOUNTER — Ambulatory Visit (INDEPENDENT_AMBULATORY_CARE_PROVIDER_SITE_OTHER): Payer: 59 | Admitting: Pharmacist

## 2015-05-21 ENCOUNTER — Encounter: Payer: Self-pay | Admitting: Pharmacist

## 2015-05-21 ENCOUNTER — Telehealth: Payer: Self-pay | Admitting: *Deleted

## 2015-05-21 DIAGNOSIS — F17200 Nicotine dependence, unspecified, uncomplicated: Secondary | ICD-10-CM

## 2015-05-21 MED ORDER — BUPROPION HCL ER (XL) 150 MG PO TB24: 150.0000 mg | ORAL_TABLET | Freq: Every day | ORAL | Status: DC

## 2015-05-21 MED ORDER — NICOTINE POLACRILEX 4 MG MT GUM: 4.0000 mg | CHEWING_GUM | OROMUCOSAL | Status: DC | PRN

## 2015-05-21 NOTE — Telephone Encounter (Signed)
Prior Authorization received from [employer] for Nicotine Polacritex. Completed online at [email or web address]. Process could take 24-72 hours to complete. Derl Barrow, RN

## 2015-05-21 NOTE — Progress Notes (Signed)
Patient ID: Eric Nielsen, male   DOB: 04-Jan-1961, 55 y.o.   MRN: [id number] Reviewed: Agree with Dr. Graylin Shiver documentation and management.

## 2015-05-21 NOTE — Assessment & Plan Note (Signed)
severe Nicotine Dependence of 40  years duration in a patient who is poor candidate for success b/c of lack of confidence in quitting.  Patient interested in getting support for quitting with the 1 800-QUIT NOW program.  Initiated nicotine replacement tx with gum 10-15 per day. Patient counseled on purpose, proper use, and potential adverse effects, including possible n/v.   Initiated bupropion ER 150 mg daily for 3 days, then increase to 300 mg daily. Denies history of seizures. Patient counseled on purpose, proper use, and potential adverse effects, including insomnia, and potential change in mood. Assess success with quitting at next visit. Pt set a quit date of today (05-21-2015).

## 2015-05-21 NOTE — Patient Instructions (Signed)
Thank you for coming to your visit today!  Quit date TODAY!  A prescription for your bupropion and Nicotine gum was sent today. If you have problems filling these prescriptions at the pharmacy please contact the clinic.   Plan: start bupropion 150 mg daily for 3 days, then increase to 300 mg daily, start nicotine gum, contact 1 800 QUIT-NOW for support  Consider changing your daily pattern to avoid temptation to smoke.   Follow-up with Pharmacy clinic in 3 weeks

## 2015-05-21 NOTE — Progress Notes (Signed)
S:  Patient arrives to clinic in pleasant mood accompanied by his wife.   Patient arrives for evaluation/assistance with tobacco dependence.  Patient was referred on Dr. Lajuana Ripple.  Patient was last seen by Primary Care Provider on 04/09/2015.   Age when started using tobacco on a daily basis 18. Number of Cigarettes per day pack per day. Brand smoked Newports in the past now [employer] and [employer]. He used to smoke Menthols but they became to expensive Estimated Nicotine Content per Cigarette (mg) 1.  Estimated Nicotine intake per day 20mg . Reports smoking about 20 cigarettes a day   Smokes first cigarette 10 minutes after waking while watching TV Smokes at night when he wakes up to go to restroom    Estimated Fagerstrom Score >5/10.   Smokes: In AM, post lunch and post meals  Most recent quit attempt has never lasted more than 3 days.  Medications (NRT, bupropion, varenicline) used in prior in past cessation efforts include: Chantix prescribed at one point but mood side effects scared the pt from taking this medication. Pt reports he could not afford Nicotine patches that were prescribed at last PCP visit  Rates IMPORTANCE of quitting tobacco on 1-10 scale of 10. Rates CONFIDENCE of quitting tobacco on 1-10 scale of 0.  Most common triggers to use tobacco include: conflict/anger  Motivation to quit: heart health, lung function/breathing better, and family hx of cancer  BP: 170/85 HR: 76  A/P: severe Nicotine Dependence of 40  years duration in a patient who is poor candidate for success b/c of lack of confidence in quitting.  Patient interested in getting support for quitting with the 1 800-QUIT NOW program.  Initiated nicotine replacement tx with gum 10-15 per day. Patient counseled on purpose, proper use, and potential adverse effects, including possible n/v.   Initiated bupropion ER 150 mg daily for 3 days, then increase to 300 mg daily. Denies history of seizures. Patient  counseled on purpose, proper use, and potential adverse effects, including insomnia, and potential change in mood. Assess success with quitting at next visit. Pt set a quit date of today (05-21-2015).    Written information provided. Provided information on 1 800-QUIT NOW support program.  F/U Rx Clinic Visit 3 weeks.  Total time in face-to-face counseling 60 minutes.  Patient seen with Rudolpho Sevin, PharmD Candidate, Jamie Brookes PharmD Candidate, and Nuala Alpha, PharmD Resident.   Marland Kitchen

## 2015-05-25 NOTE — Telephone Encounter (Signed)
PA is still pending for review at [employer].  Derl Barrow, RN

## 2015-05-31 NOTE — Telephone Encounter (Signed)
PA approved for Nicotine Gum.  Reference number: [id number].  Derl Barrow, RN

## 2015-06-09 ENCOUNTER — Other Ambulatory Visit: Payer: Self-pay | Admitting: Family Medicine

## 2015-06-09 ENCOUNTER — Other Ambulatory Visit: Payer: Self-pay | Admitting: *Deleted

## 2015-06-09 DIAGNOSIS — I10 Essential (primary) hypertension: Secondary | ICD-10-CM

## 2015-06-09 MED ORDER — AMLODIPINE BESYLATE 5 MG PO TABS: 5.0000 mg | ORAL_TABLET | Freq: Every day | ORAL | Status: DC

## 2015-06-11 ENCOUNTER — Ambulatory Visit: Payer: 59 | Admitting: Pharmacist

## 2015-10-20 ENCOUNTER — Telehealth: Payer: Self-pay | Admitting: *Deleted

## 2015-10-20 ENCOUNTER — Ambulatory Visit: Payer: 59 | Admitting: Family Medicine

## 2015-10-20 NOTE — Telephone Encounter (Signed)
Patient wife states patient came for his appointment today and was turned away after his card didn't go through, although after she called the company the error was on our side. Wife has an appointment with Dr. Lajuana Ripple on 8/7 at [facility] and wants to know is she could see patient at the same time (PCP has no available appts left). Please call patient or wife back.

## 2015-10-20 NOTE — Telephone Encounter (Signed)
Unfortunately, I am unable to accommodate a double book, due to time constraints and this is against our office scheduling policy for residents.  Patient may, however, see another provider during his wife's time slot if there is an appointment available.

## 2015-10-22 NOTE — Telephone Encounter (Signed)
LVM for pt to call back to inform him of the message below and let him know that there are no other openings with any other doctors at the same time. See if pt wants to schedule another appointment with PCP at next available clinic or he can see someone else on a different day. Katharina Caper, Meggan Dhaliwal D, [place]

## 2015-10-26 NOTE — Telephone Encounter (Signed)
Pt has an appointment scheduled for 11/02/2015. Katharina Caper, Santanna Olenik D, [place]

## 2015-11-02 ENCOUNTER — Ambulatory Visit (INDEPENDENT_AMBULATORY_CARE_PROVIDER_SITE_OTHER): Payer: 59 | Admitting: Family Medicine

## 2015-11-02 ENCOUNTER — Encounter: Payer: Self-pay | Admitting: Family Medicine

## 2015-11-02 VITALS — BP 170/88 | HR 86 | Temp 98.7°F | Ht 71.0 in | Wt 206.8 lb

## 2015-11-02 DIAGNOSIS — I10 Essential (primary) hypertension: Secondary | ICD-10-CM | POA: Diagnosis not present

## 2015-11-02 DIAGNOSIS — R208 Other disturbances of skin sensation: Secondary | ICD-10-CM | POA: Diagnosis not present

## 2015-11-02 DIAGNOSIS — R200 Anesthesia of skin: Secondary | ICD-10-CM

## 2015-11-02 DIAGNOSIS — M545 Low back pain: Secondary | ICD-10-CM

## 2015-11-02 DIAGNOSIS — F17200 Nicotine dependence, unspecified, uncomplicated: Secondary | ICD-10-CM

## 2015-11-02 DIAGNOSIS — G8929 Other chronic pain: Secondary | ICD-10-CM | POA: Insufficient documentation

## 2015-11-02 MED ORDER — ATORVASTATIN CALCIUM 40 MG PO TABS: 40.0000 mg | ORAL_TABLET | Freq: Every day | ORAL | 3 refills | Status: DC

## 2015-11-02 MED ORDER — AMLODIPINE BESYLATE 10 MG PO TABS: 10.0000 mg | ORAL_TABLET | Freq: Every day | ORAL | 3 refills | Status: DC

## 2015-11-02 MED ORDER — MELOXICAM 15 MG PO TABS: 15.0000 mg | ORAL_TABLET | Freq: Every day | ORAL | 0 refills | Status: DC

## 2015-11-02 NOTE — Progress Notes (Signed)
    Subjective: CC: low back pain HPI: Eric Nielsen is a 55 y.o. male presenting to clinic today for office visit. Concerns today include:  1. Low back pain Reports that LBP onset was 2013.  Saw an ortho, who recommended surgery.  Patient describes pain 8/10.  He reports LBP as achy.  He reports that he is not doing anything for back pain right now.  He reports he has never had injections into the back.  No numbness, tingling, weakness in LE.  No urinary retention, fecal incontinence.  2. Numbness He also reports numbness that radiates down left arm that had previously resolved but reoccurred about 1 month ago.  He reports that it comes at random and goes from upper arm all the way down to finger tips.  It lasts about 5 seconds and then goes away.  No h/o injury.  NO weakness.  No other neurologic deficits during episodes.  He reports that episodes occur twice weekly.    3. Hypertension Blood pressure at home: 150-175/85 Blood pressure today: 186/90, repeat 170/88 Meds: Compliant with Lisinopril 40mg , Norvasc 5mg  ROS: Denies headache, dizziness, visual changes, nausea, vomiting, chest pain, abdominal pain or shortness of breath.  Social History Reviewed: active 1/2 ppd smoker ([place] menthols). FamHx and MedHx reviewed.  Please see EMR. Health Maintenance: Colonoscopy due  ROS: Per HPI  Objective: Office vital signs reviewed. BP (!) 186/90   Pulse 86   Temp 98.7 F (37.1 C) (Oral)   Ht 5\' 11"  (1.803 m)   Wt 206 lb 12.8 oz (93.8 kg)   BMI 28.84 kg/m   Physical Examination:  General: Awake, alert, well nourished, No acute distress HEENT: Normal, sclera white Cardio: regular rate and rhythm, S1S2 heard, no murmurs appreciated Pulm: no wheezes, normal WOB on room air Skin: dry, intact, no rashes or lesions Neuro: decreased extension of lumbar spine, pain with left sidebending of lumbar spine, otherwise full painless AROM, c-spine FROM.  Negative spurlings.  UE and LE  strength 5/5; sensation grossly intact, patellar DTRs 1/4 b/l  Assessment/ Plan: 55 y.o. male   HTN, goal below 140/90 Uncontrolled.  Norvasc increased to 10mg  today.  Continue lisinopril.  Recommend smoking cessation.  Lipitor 40mg  qd added for ASCVD risk 29.6% - Will plan to discuss adding ASA at a later visit, as Mobic started today. - f/u 2 weeks for BP check  Tobacco use disorder Contemplative.  Currently on Wellbutrin.  Not using Nicontine patch.  Discussed impact on Bp and cardiovascular risk.  Chronic bilateral low back pain without sciatica.  No neurologic deficits on exam.  Likely arthritic in nature. - Sports med advisor back exercises provided - Defer imaging to ortho. - meloxicam (MOBIC) 15 MG tablet; Take 1 tablet (15 mg total) by mouth daily.  Dispense: 30 tablet; Refill: 0 - Ambulatory referral to Orthopedic Surgery  Left arm numbness.  Doubt TIA though ASCVD risk ~30%.  More likely c-spine related - Discussed red flag signs of TIA/ Stroke. - Ambulatory referral to [facility], DO PGY-3, [facility]

## 2015-11-02 NOTE — Assessment & Plan Note (Signed)
Contemplative.  Currently on Wellbutrin.  Not using Nicontine patch.  Discussed impact on Bp and cardiovascular risk.

## 2015-11-02 NOTE — Assessment & Plan Note (Signed)
Uncontrolled.  Norvasc increased to 10mg  today.  Continue lisinopril.  Recommend smoking cessation.  Lipitor 40mg  qd added for ASCVD risk 29.6% - Will plan to discuss adding ASA at a later visit, as Mobic started today. - f/u 2 weeks for BP check

## 2015-11-02 NOTE — Patient Instructions (Signed)
I have sent in a new prescription for your Amlodipine, given your elevated blood pressures.  If you have Amlodipine 5mg  at home you can take 2 daily until your bottle is gone, then start the new prescription.  I have also sent in Mobic to help with back pain.  I will refer you to ortho for possible back injections.  I have also sent in a medication called Lipitor to help reduce your risk of stroke and heart attack.  See me in 2 weeks for you blood pressure. Smoking Cessation, Tips for Success If you are ready to quit smoking, congratulations! You have chosen to help yourself be healthier. Cigarettes bring nicotine, tar, carbon monoxide, and other irritants into your body. Your lungs, heart, and blood vessels will be able to work better without these poisons. There are many different ways to quit smoking. Nicotine gum, nicotine patches, a nicotine inhaler, or nicotine nasal spray can help with physical craving. Hypnosis, support groups, and medicines help break the habit of smoking. WHAT THINGS CAN I DO TO MAKE QUITTING EASIER?  Here are some tips to help you quit for good:  Pick a date when you will quit smoking completely. Tell all of your friends and family about your plan to quit on that date.  Do not try to slowly cut down on the number of cigarettes you are smoking. Pick a quit date and quit smoking completely starting on that day.  Throw away all cigarettes.   Clean and remove all ashtrays from your home, work, and car.  On a card, write down your reasons for quitting. Carry the card with you and read it when you get the urge to smoke.  Cleanse your body of nicotine. Drink enough water and fluids to keep your urine clear or pale yellow. Do this after quitting to flush the nicotine from your body.  Learn to predict your moods. Do not let a bad situation be your excuse to have a cigarette. Some situations in your life might tempt you into wanting a cigarette.  Never have "just one"  cigarette. It leads to wanting another and another. Remind yourself of your decision to quit.  Change habits associated with smoking. If you smoked while driving or when feeling stressed, try other activities to replace smoking. Stand up when drinking your coffee. Brush your teeth after eating. Sit in a different chair when you read the paper. Avoid alcohol while trying to quit, and try to drink fewer caffeinated beverages. Alcohol and caffeine may urge you to smoke.  Avoid foods and drinks that can trigger a desire to smoke, such as sugary or spicy foods and alcohol.  Ask people who smoke not to smoke around you.  Have something planned to do right after eating or having a cup of coffee. For example, plan to take a walk or exercise.  Try a relaxation exercise to calm you down and decrease your stress. Remember, you may be tense and nervous for the first 2 weeks after you quit, but this will pass.  Find new activities to keep your hands busy. Play with a pen, coin, or rubber band. Doodle or draw things on paper.  Brush your teeth right after eating. This will help cut down on the craving for the taste of tobacco after meals. You can also try mouthwash.   Use oral substitutes in place of cigarettes. Try using lemon drops, carrots, cinnamon sticks, or chewing gum. Keep them handy so they are available when you have  the urge to smoke.  When you have the urge to smoke, try deep breathing.  Designate your home as a nonsmoking area.  If you are a heavy smoker, ask your health care provider about a prescription for nicotine chewing gum. It can ease your withdrawal from nicotine.  Reward yourself. Set aside the cigarette money you save and buy yourself something nice.  Look for support from others. Join a support group or smoking cessation program. Ask someone at home or at work to help you with your plan to quit smoking.  Always ask yourself, "Do I need this cigarette or is this just a reflex?"  Tell yourself, "Today, I choose not to smoke," or "I do not want to smoke." You are reminding yourself of your decision to quit.  Do not replace cigarette smoking with electronic cigarettes (commonly called e-cigarettes). The safety of e-cigarettes is unknown, and some may contain harmful chemicals.  If you relapse, do not give up! Plan ahead and think about what you will do the next time you get the urge to smoke. HOW WILL I FEEL WHEN I QUIT SMOKING? You may have symptoms of withdrawal because your body is used to nicotine (the addictive substance in cigarettes). You may crave cigarettes, be irritable, feel very hungry, cough often, get headaches, or have difficulty concentrating. The withdrawal symptoms are only temporary. They are strongest when you first quit but will go away within 10-14 days. When withdrawal symptoms occur, stay in control. Think about your reasons for quitting. Remind yourself that these are signs that your body is healing and getting used to being without cigarettes. Remember that withdrawal symptoms are easier to treat than the major diseases that smoking can cause.  Even after the withdrawal is over, expect periodic urges to smoke. However, these cravings are generally short lived and will go away whether you smoke or not. Do not smoke! WHAT RESOURCES ARE AVAILABLE TO HELP ME QUIT SMOKING? Your health care provider can direct you to community resources or hospitals for support, which may include:  Group support.  Education.  Hypnosis.  Therapy.   This information is not intended to replace advice given to you by your health care provider. Make sure you discuss any questions you have with your health care provider.   Document Released: 12/03/2003 Document Revised: 03/27/2014 Document Reviewed: 08/22/2012 Elsevier Interactive Patient Education [employer].

## 2015-11-23 ENCOUNTER — Other Ambulatory Visit (HOSPITAL_COMMUNITY): Payer: Self-pay | Admitting: Sports Medicine

## 2015-11-23 DIAGNOSIS — M545 Low back pain: Secondary | ICD-10-CM

## 2015-11-23 DIAGNOSIS — M5030 Other cervical disc degeneration, unspecified cervical region: Secondary | ICD-10-CM

## 2016-01-17 NOTE — Progress Notes (Signed)
   CC: breast mass  HPI: Eric Nielsen is a 55 y.o. male with PMHx significant for HTN, trochanteric bursitis of the left hip, and tobacco use  who presents to [facility] today with a tender breast mass of 1 weeks duration.  - noticed pain over right areola about 1 week ago - discomfort increased leading him to come into the office - no erythema, warmth or drainage, no fevers - no history of similar episodes in the past - no spironolactone use - no history of cancer - patient is a smoker, quit smoking as soon as he developed this mass and plans to continue abstinence from tobacco   Review of Symptoms: See HPI for ROS.   CC, SH/smoking status, and VS noted.  Objective: BP (!) 168/98   Pulse 70   Temp 98.6 F (37 C) (Oral)   Ht 5\' 11"  (1.803 m)   Wt 212 lb (96.2 kg)   SpO2 97%   BMI 29.57 kg/m  GEN: NAD, alert, cooperative, and pleasant. Skin: +2 cm hard breast mass under the right areola without fluctuance, drainage, warmth or redness. Otherwise no rashes or lesions noted Lymph: no appreciable lymphadenopathy  Assessment and plan:  Breast mass, right - given time frame of development in 1 week and tenderness, most concerning for small abscess - offered I&D or needle drainage today in the office however patient opts for warm compresses and watchful waiting - gave doxycycline x 5d - strict return precautions provided, warning signs communicated with the patient - patient advised to follow up if infection is not significantly improved or resolved in 2 weeks. If abscess grows in size advised to come in for I&D, for signs of systemic infection or streaking advised to go to the ED urgently.    Meds ordered this encounter  Medications  . doxycycline (VIBRA-TABS) 100 MG tablet    Sig: Take 1 tablet (100 mg total) by mouth daily.    Dispense:  5 tablet    Refill:  0     Everrett Coombe, MD,MS,  PGY1 01/18/2016 8:55 PM

## 2016-01-18 ENCOUNTER — Ambulatory Visit (INDEPENDENT_AMBULATORY_CARE_PROVIDER_SITE_OTHER): Payer: 59 | Admitting: Student in an Organized Health Care Education/Training Program

## 2016-01-18 ENCOUNTER — Encounter: Payer: Self-pay | Admitting: Student in an Organized Health Care Education/Training Program

## 2016-01-18 DIAGNOSIS — N6310 Unspecified lump in the right breast, unspecified quadrant: Secondary | ICD-10-CM | POA: Insufficient documentation

## 2016-01-18 MED ORDER — DOXYCYCLINE HYCLATE 100 MG PO TABS: 100.0000 mg | ORAL_TABLET | Freq: Every day | ORAL | 0 refills | Status: AC

## 2016-01-18 NOTE — Patient Instructions (Signed)
It was a pleasure seeing you today in our clinic. Today we discussed the bump on your right breast. Here is the treatment plan we have discussed and agreed upon together:  This is likely a small infection.  - Please use warm compresses for 10 minutes three times daily for the next week or until the infection resolves - Please complete the 5 day course of antibiotics, which I sent to your pharmacy  If the infection does not resolve over the next two weeks, or if the mass grows larger in size we will likely have to drain the infection.  Please follow up with [place] in 2 weeks if the infection is not significantly improved or resolved.  If you notice more pain, fevers, redness or streaking up your chest this is a sign that the infection is much worse and you should be seen in the emergency department immediately.  Our clinic's number is [phone]. Please call with questions or concerns about what we discussed today.  Be well, Dr. Burr Medico

## 2016-01-18 NOTE — Assessment & Plan Note (Addendum)
-   given time frame of development in 1 week and tenderness, most concerning for small abscess - offered I&D or needle drainage today in the office however patient opts for warm compresses and watchful waiting - gave doxycycline x 5d - strict return precautions provided, warning signs communicated with the patient - patient advised to follow up if infection is not significantly improved or resolved in 2 weeks. If abscess grows in size advised to come in for I&D, for signs of systemic infection or streaking advised to go to the ED urgently.

## 2016-04-24 ENCOUNTER — Encounter: Payer: Self-pay | Admitting: Family Medicine

## 2016-04-24 ENCOUNTER — Ambulatory Visit (INDEPENDENT_AMBULATORY_CARE_PROVIDER_SITE_OTHER): Payer: 59 | Admitting: Family Medicine

## 2016-04-24 VITALS — BP 138/76 | HR 86 | Temp 98.9°F | Ht 71.0 in | Wt 209.6 lb

## 2016-04-24 DIAGNOSIS — F17200 Nicotine dependence, unspecified, uncomplicated: Secondary | ICD-10-CM | POA: Diagnosis not present

## 2016-04-24 DIAGNOSIS — Z0000 Encounter for general adult medical examination without abnormal findings: Secondary | ICD-10-CM

## 2016-04-24 DIAGNOSIS — R200 Anesthesia of skin: Secondary | ICD-10-CM

## 2016-04-24 DIAGNOSIS — G8929 Other chronic pain: Secondary | ICD-10-CM

## 2016-04-24 DIAGNOSIS — I10 Essential (primary) hypertension: Secondary | ICD-10-CM

## 2016-04-24 DIAGNOSIS — M545 Low back pain: Secondary | ICD-10-CM | POA: Diagnosis not present

## 2016-04-24 DIAGNOSIS — R202 Paresthesia of skin: Secondary | ICD-10-CM

## 2016-04-24 MED ORDER — MELOXICAM 15 MG PO TABS: 15.0000 mg | ORAL_TABLET | Freq: Every day | ORAL | 0 refills | Status: DC

## 2016-04-24 MED ORDER — AMLODIPINE BESYLATE 10 MG PO TABS: 10.0000 mg | ORAL_TABLET | Freq: Every day | ORAL | 3 refills | Status: DC

## 2016-04-24 MED ORDER — LISINOPRIL 40 MG PO TABS: ORAL_TABLET | ORAL | 12 refills | Status: DC

## 2016-04-24 MED ORDER — ATORVASTATIN CALCIUM 40 MG PO TABS: 40.0000 mg | ORAL_TABLET | Freq: Every day | ORAL | 3 refills | Status: DC

## 2016-04-24 MED ORDER — TRAMADOL HCL 50 MG PO TABS: 50.0000 mg | ORAL_TABLET | Freq: Three times a day (TID) | ORAL | 0 refills | Status: DC | PRN

## 2016-04-24 NOTE — Assessment & Plan Note (Signed)
Contemplative.  Smoking cessation encouraged.

## 2016-04-24 NOTE — Assessment & Plan Note (Signed)
Well controlled on Norvasc 10mg  and Lisinopril 40mg  daily.  Patient also on a statin.  CMP, lipid panel ordered.

## 2016-04-24 NOTE — Patient Instructions (Signed)
Schedule your colonoscopy.  Plan to follow up with Dr Drema Dallas as recommended at your last visit with him.  I encourage you to stop smoking.  This is the single best thing that you can do for your health.

## 2016-04-24 NOTE — Assessment & Plan Note (Addendum)
Patient referred back to Dr Drema Dallas at [facility] ortho.  MRI neck and low back recommended in 10/2015.  No red flags.  Tramadol and Mobic refilled.

## 2016-04-24 NOTE — Progress Notes (Signed)
Eric Nielsen is a 56 y.o. male presents to office today for annual physical exam examination.  Concerns today include:  1. RUE tingling Patient reports intermittent RUE tingling that has been ongoing for 1 week.  He notes that this used to be left sided.  He is seeing Dr Drema Dallas at [facility] ortho for this.  MRI of c and L spines recommended at last appt.  He reports that he had these done at another ortho so he provided the disc to ortho for this.  He has not followed up since.  He reports intermittent weakness of right upper extremity, citing difficulty opening jars.  Denies h/o injury.  Last eye exam: >1 year Last dental exam: >1 year Last colonoscopy: never  Past Medical History:  Diagnosis Date  . Abscess, perirectal   . Arthritis   . HTN (hypertension)    Social History   Social History  . Marital status: Married    Spouse name: N/A  . Number of children: N/A  . Years of education: N/A   Occupational History  . Not on file.   Social History Main Topics  . Smoking status: Former Smoker    Packs/day: 0.50    Years: 39.00    Types: Cigarettes    Start date: 03/20/1976    Quit date: 01/07/2016  . Smokeless tobacco: Never Used  . Alcohol use 3.6 oz/week    6 Cans of beer per week  . Drug use: No  . Sexual activity: Yes    Partners: Female   Other Topics Concern  . Not on file   Social History Narrative  . No narrative on file   Past Surgical History:  Procedure Laterality Date  . INCISION AND DRAINAGE PERIRECTAL ABSCESS N/A 10/14/2012   Procedure: IRRIGATION AND DEBRIDEMENT PERIRECTAL ABSCESS;  Surgeon: Zenovia Jarred, MD;  Location: [facility];  Service: General;  Laterality: N/A;  . INCISION AND DRAINAGE PERIRECTAL ABSCESS N/A 01/23/2015   Procedure: EXAM UNDER ANESTHESIA;  Surgeon: Erroll Luna, MD;  Location: [facility] OR;  Service: General;  Laterality: N/A;   Family History  Problem Relation Age of Onset  . Hypertension Mother   . Cancer Mother     Lung and breast  and brain  . Heart failure Father   . Hypertension Father   . Hypertension Brother   . Diabetes Brother     ROS: Review of Systems Constitutional: negative Eyes: negative Ears, nose, mouth, throat, and face: negative Respiratory: negative Cardiovascular: negative Gastrointestinal: negative Genitourinary:negative Integument/breast: negative Hematologic/lymphatic: negative Musculoskeletal:positive for arthralgias and back pain Neurological: positive for intermittent n/tingling in RUE Behavioral/Psych: negative Endocrine: negative Allergic/Immunologic: negative    Physical exam BP 138/76   Pulse 86   Temp 98.9 F (37.2 C) (Oral)   Ht 5\' 11"  (1.803 m)   Wt 209 lb 9.6 oz (95.1 kg)   SpO2 98%   BMI 29.23 kg/m  General appearance: alert, cooperative, appears stated age and no distress Head: Normocephalic, without obvious abnormality, atraumatic Eyes: negative findings: lids and lashes normal, conjunctivae and sclerae normal, corneas clear and pupils equal, round, reactive to light and accomodation Ears: normal TM's and external ear canals both ears Nose: Nares normal. Septum midline. Mucosa normal. No drainage or sinus tenderness. Throat: lips, mucosa, and tongue normal; teeth and gums normal Neck: no adenopathy, no carotid bruit, no JVD, supple, symmetrical, trachea midline and thyroid not enlarged, symmetric, no tenderness/mass/nodules Back: symmetric, no curvature. ROM normal. No CVA tenderness. Lungs: clear to auscultation  bilaterally Heart: regular rate and rhythm, S1, S2 normal, no murmur, click, rub or gallop Abdomen: soft, non-tender; bowel sounds normal; no masses,  no organomegaly Rectal: deferred Extremities: extremities normal, atraumatic, no cyanosis or edema Pulses: 2+ and symmetric Skin: Skin color, texture, turgor normal. No rashes or lesions Lymph nodes: Cervical, supraclavicular, and axillary nodes normal. Neurologic: Grossly normal    Assessment/  Plan: [name] here for annual physical exam.   HTN, goal below 140/90 Well controlled on Norvasc 10mg  and Lisinopril 40mg  daily.  Patient also on a statin.  CMP, lipid panel ordered.  Tobacco use disorder Contemplative.  Smoking cessation encouraged.  Chronic bilateral low back pain without sciatica Patient referred back to Dr Drema Dallas at [facility] ortho.  MRI neck and low back recommended in 10/2015.  No red flags.  Tramadol and Mobic refilled.   Numbness and tingling of right upper extremity I suspect that this is related to the C4-6 degenerative changes appreciated on imaging with [place] ortho.  He has no focal findings on exam today.  Their last note reviewed.  Patient to follow up with Dr Drema Dallas for MRI.  Return precautions reviewed.  Drea Jurewicz M. Lajuana Ripple, DO PGY-3, [facility]

## 2016-05-17 ENCOUNTER — Encounter: Payer: Self-pay | Admitting: Gastroenterology

## 2016-06-15 ENCOUNTER — Other Ambulatory Visit: Payer: Self-pay | Admitting: Family Medicine

## 2016-06-15 DIAGNOSIS — M545 Low back pain: Principal | ICD-10-CM

## 2016-06-15 DIAGNOSIS — G8929 Other chronic pain: Secondary | ICD-10-CM

## 2016-06-15 NOTE — Telephone Encounter (Signed)
Please have patient come in for fasting labs.  These were placed at his annual exam in February.  Need an updated renal function.

## 2016-06-19 NOTE — Telephone Encounter (Signed)
Pt informed. He will check schedule with wife and give [place] a call to schedule. Fleeger, Salome Spotted, CMA

## 2016-06-23 ENCOUNTER — Encounter: Payer: Self-pay | Admitting: *Deleted

## 2016-06-23 ENCOUNTER — Telehealth: Payer: Self-pay | Admitting: *Deleted

## 2016-06-23 NOTE — Telephone Encounter (Signed)
Left message with vm to call us to rechedule his previst or we may have to cancel his colonoscopy.

## 2016-06-26 ENCOUNTER — Encounter: Payer: Self-pay | Admitting: Family Medicine

## 2016-06-26 ENCOUNTER — Ambulatory Visit (INDEPENDENT_AMBULATORY_CARE_PROVIDER_SITE_OTHER): Payer: 59 | Admitting: Family Medicine

## 2016-06-26 VITALS — BP 128/74 | HR 77 | Temp 98.7°F | Ht 71.0 in | Wt 214.8 lb

## 2016-06-26 DIAGNOSIS — I10 Essential (primary) hypertension: Secondary | ICD-10-CM | POA: Diagnosis not present

## 2016-06-26 DIAGNOSIS — F17200 Nicotine dependence, unspecified, uncomplicated: Secondary | ICD-10-CM | POA: Diagnosis not present

## 2016-06-26 NOTE — Progress Notes (Signed)
    Subjective: CC: labs HPI: Ellwyn Sallade is a 56 y.o. male presenting to clinic today for:  1. Tobacco use d/o Patient is an active smoker, 1/2 ppd.  Used to smoke 1 ppd since age 39.  He denies cough, SOB.  Precontemplative.  2. Hypertension Blood pressure today: 128/74 Meds: Compliant with Norvasc, Lisinopril ROS: Denies headache, dizziness, visual changes, nausea, vomiting, chest pain, abdominal pain or shortness of breath.  Social Hx reviewed:active smoker. MedHx, medications and allergies reviewed.  Please see EMR. Health Maintenance: colonoscopy due  ROS: Per HPI  Objective: Office vital signs reviewed. BP 128/74   Pulse 77   Temp 98.7 F (37.1 C) (Oral)   Ht 5\' 11"  (1.803 m)   Wt 214 lb 12.8 oz (97.4 kg)   SpO2 96%   BMI 29.96 kg/m   Physical Examination:  General: Awake, alert, well nourished, No acute distress HEENT: Normal    Eyes: PERRLA, EOMI, sclera white    Nose: nasal turbinates moist, no nasal discharge    Throat: moist mucus membranes, no erythema, no tonsillar exudate.  Airway is patent Cardio: regular rate and rhythm, S1S2 heard, no murmurs appreciated Pulm: clear to auscultation bilaterally, no wheezes, rhonchi or rales; normal work of breathing on room air GI: soft, non-tender, non-distended, bowel sounds present x4, no hepatomegaly, no splenomegaly, no masses  Assessment/ Plan: 56 y.o. male   Tobacco use disorder Precontemplative.  Smoking 1/2 ppd.  If still smoking at age 19, will order low dose CT chest to screen for lung cancer.  Will continue to counsel and provide resources.  HTN, goal below 140/90 At goal.  Continue current regimen.  CMP, Lipid obtained today.  Follow up prn.  Patient to call gastro office, as it appears that they called him recently regarding his colonoscopy.  Janora Norlander, DO PGY-3, [facility]

## 2016-06-26 NOTE — Patient Instructions (Signed)
Call your gastric doctor about your colonoscopy.  I will contact you will the results of your labs.  If anything is abnormal, I will call you.  Otherwise, expect a copy to be mailed to you.

## 2016-06-26 NOTE — Assessment & Plan Note (Signed)
At goal.  Continue current regimen.  CMP, Lipid obtained today.

## 2016-06-26 NOTE — Assessment & Plan Note (Signed)
Precontemplative.  Smoking 1/2 ppd.  If still smoking at age 56, will order low dose CT chest to screen for lung cancer.  Will continue to counsel and provide resources.

## 2016-06-27 LAB — CBC
HEMATOCRIT: 46.3 % (ref 37.5–51.0)
HEMOGLOBIN: 16.3 g/dL (ref 13.0–17.7)
MCH: 32.9 pg (ref 26.6–33.0)
MCHC: 35.2 g/dL (ref 31.5–35.7)
MCV: 94 fL (ref 79–97)
Platelets: 121 10*3/uL — ABNORMAL LOW (ref 150–379)
RBC: 4.95 x10E6/uL (ref 4.14–5.80)
RDW: 13.9 % (ref 12.3–15.4)
WBC: 3.9 10*3/uL (ref 3.4–10.8)

## 2016-06-30 ENCOUNTER — Ambulatory Visit (AMBULATORY_SURGERY_CENTER): Payer: Self-pay

## 2016-06-30 ENCOUNTER — Encounter: Payer: Self-pay | Admitting: Gastroenterology

## 2016-06-30 VITALS — Ht 71.0 in | Wt 214.0 lb

## 2016-06-30 DIAGNOSIS — Z1211 Encounter for screening for malignant neoplasm of colon: Secondary | ICD-10-CM

## 2016-06-30 MED ORDER — SUPREP BOWEL PREP KIT 17.5-3.13-1.6 GM/177ML PO SOLN: 1.0000 | Freq: Once | ORAL | 0 refills | Status: AC

## 2016-06-30 NOTE — Progress Notes (Signed)
No allergies to eggs or soy No diet meds No home oxygen No past problems with anesthesia  Registered for emmi 

## 2016-07-14 ENCOUNTER — Ambulatory Visit (AMBULATORY_SURGERY_CENTER): Payer: 59 | Admitting: Gastroenterology

## 2016-07-14 ENCOUNTER — Encounter: Payer: Self-pay | Admitting: Gastroenterology

## 2016-07-14 VITALS — BP 149/84 | HR 61 | Temp 98.6°F | Resp 24 | Ht 71.0 in | Wt 214.0 lb

## 2016-07-14 DIAGNOSIS — Z1212 Encounter for screening for malignant neoplasm of rectum: Secondary | ICD-10-CM | POA: Diagnosis not present

## 2016-07-14 DIAGNOSIS — D123 Benign neoplasm of transverse colon: Secondary | ICD-10-CM | POA: Diagnosis not present

## 2016-07-14 DIAGNOSIS — Z1211 Encounter for screening for malignant neoplasm of colon: Secondary | ICD-10-CM

## 2016-07-14 MED ORDER — SODIUM CHLORIDE 0.9 % IV SOLN: 500.0000 mL | INTRAVENOUS | Status: DC

## 2016-07-14 NOTE — Progress Notes (Signed)
To recovery, report to Scott, RN, VSS 

## 2016-07-14 NOTE — Patient Instructions (Signed)
YOU HAD AN ENDOSCOPIC PROCEDURE TODAY AT THE [facility]:   Refer to the procedure report that was given to you for any specific questions about what was found during the examination.  If the procedure report does not answer your questions, please call your gastroenterologist to clarify.  If you requested that your care partner not be given the details of your procedure findings, then the procedure report has been included in a sealed envelope for you to review at your convenience later.  YOU SHOULD EXPECT: Some feelings of bloating in the abdomen. Passage of more gas than usual.  Walking can help get rid of the air that was put into your GI tract during the procedure and reduce the bloating. If you had a lower endoscopy (such as a colonoscopy or flexible sigmoidoscopy) you may notice spotting of blood in your stool or on the toilet paper. If you underwent a bowel prep for your procedure, you may not have a normal bowel movement for a few days.  Please Note:  You might notice some irritation and congestion in your nose or some drainage.  This is from the oxygen used during your procedure.  There is no need for concern and it should clear up in a day or so.  SYMPTOMS TO REPORT IMMEDIATELY:   Following lower endoscopy (colonoscopy or flexible sigmoidoscopy):  Excessive amounts of blood in the stool  Significant tenderness or worsening of abdominal pains  Swelling of the abdomen that is new, acute  Fever of 100F or higher   For urgent or emergent issues, a gastroenterologist can be reached at any hour by calling [phone].   DIET:  We do recommend a small meal at first, but then you may proceed to your regular diet.  Drink plenty of fluids but you should avoid alcoholic beverages for 24 hours.  ACTIVITY:  You should plan to take it easy for the rest of today and you should NOT DRIVE or use heavy machinery until tomorrow (because of the sedation medicines used during the test).     FOLLOW UP: Our staff will call the number listed on your records the next business day following your procedure to check on you and address any questions or concerns that you may have regarding the information given to you following your procedure. If we do not reach you, we will leave a message.  However, if you are feeling well and you are not experiencing any problems, there is no need to return our call.  We will assume that you have returned to your regular daily activities without incident.  If any biopsies were taken you will be contacted by phone or by letter within the next 1-3 weeks.  Please call us at [phone] if you have not heard about the biopsies in 3 weeks.    SIGNATURES/CONFIDENTIALITY: You and/or your care partner have signed paperwork which will be entered into your electronic medical record.  These signatures attest to the fact that that the information above on your After Visit Summary has been reviewed and is understood.  Full responsibility of the confidentiality of this discharge information lies with you and/or your care-partner.  Polyp information given. Hemorrhoid information given. 

## 2016-07-14 NOTE — Op Note (Signed)
Eric Nielsen: Eric Nielsen Procedure Date: 07/14/2016 1:34 PM MRN: [id number] Endoscopist: Mallie Mussel L. Eric Nielsen , MD Age: 56 Referring MD:  Date of Birth: 10/21/1960 Gender: Male Account #: [id number] Procedure:                Colonoscopy Indications:              Screening for colorectal malignant neoplasm, This                            is the patient's first colonoscopy Medicines:                Monitored Anesthesia Care Procedure:                Pre-Anesthesia Assessment:                           - Prior to the procedure, a History and Physical                            was performed, and patient medications and                            allergies were reviewed. The patient's tolerance of                            previous anesthesia was also reviewed. The risks                            and benefits of the procedure and the sedation                            options and risks were discussed with the patient.                            All questions were answered, and informed consent                            was obtained. Prior Anticoagulants: The patient has                            taken no previous anticoagulant or antiplatelet                            agents. ASA Grade Assessment: II - A patient with                            mild systemic disease. After reviewing the risks                            and benefits, the patient was deemed in                            satisfactory condition to undergo the procedure.  After obtaining informed consent, the colonoscope                            was passed under direct vision. Throughout the                            procedure, the patient's blood pressure, pulse, and                            oxygen saturations were monitored continuously. The                            Colonoscope was introduced through the anus and                            advanced to the the cecum,  identified by                            appendiceal orifice and ileocecal valve. The                            colonoscopy was performed without difficulty. The                            patient tolerated the procedure well. The quality                            of the bowel preparation was good. The ileocecal                            valve, appendiceal orifice, and rectum were                            photographed. The quality of the bowel preparation                            was evaluated using the BBPS [facility] Bowel                            Preparation Scale) with scores of: Right Colon = 2,                            Transverse Colon = 2 and Left Colon = 2. The total                            BBPS score equals 6. The bowel preparation used was                            SUPREP. Scope In: 1:43:21 PM Scope Out: 1:54:24 PM Scope Withdrawal Time: 0 hours 8 minutes 47 seconds  Total Procedure Duration: 0 hours 11 minutes 3 seconds  Findings:                 The perianal and digital rectal  examinations were                            normal.                           A 4 mm polyp was found in the mid transverse colon.                            The polyp was sessile. The polyp was removed with a                            cold snare. Resection and retrieval were complete.                           The exam was otherwise without abnormality on                            direct and retroflexion views.                           Internal hemorrhoids were found during                            retroflexion. The hemorrhoids were medium-sized and                            Grade I (internal hemorrhoids that do not prolapse). Complications:            No immediate complications. Estimated Blood Loss:     Estimated blood loss: none. Impression:               - One 4 mm polyp in the mid transverse colon,                            removed with a cold snare. Resected and retrieved.                            - The examination was otherwise normal on direct                            and retroflexion views.                           - Internal hemorrhoids. Recommendation:           - Patient has a contact number available for                            emergencies. The signs and symptoms of potential                            delayed complications were discussed with the                            patient. Return to normal activities tomorrow.  Written discharge instructions were provided to the                            patient.                           - Resume previous diet.                           - Continue present medications.                           - Await pathology results.                           - Repeat colonoscopy is recommended for                            surveillance. The colonoscopy date will be                            determined after pathology results from today's                            exam become available for review. Carlester Kasparek L. Eric Carrow, MD 07/14/2016 2:00:00 PM This report has been signed electronically.

## 2016-07-14 NOTE — Progress Notes (Signed)
Pt's states no medical or surgical changes since previsit or office visit. 

## 2016-07-14 NOTE — Progress Notes (Signed)
Called to room to assist during endoscopic procedure.  Patient ID and intended procedure confirmed with present staff. Received instructions for my participation in the procedure from the performing physician.  

## 2016-07-17 ENCOUNTER — Telehealth: Payer: Self-pay | Admitting: *Deleted

## 2016-07-17 NOTE — Telephone Encounter (Signed)
  Follow up Call-  Call back number 07/14/2016  Post procedure Call Back phone  # [phone]  Permission to leave phone message Yes  Some recent data might be hidden     Patient questions:  Do you have a fever, pain , or abdominal swelling? No. Pain Score  0 *  Have you tolerated food without any problems? Yes.    Have you been able to return to your normal activities? Yes.    Do you have any questions about your discharge instructions: Diet   No. Medications  No. Follow up visit  No.  Do you have questions or concerns about your Care? No.  Actions: * If pain score is 4 or above: No action needed, pain <4.

## 2016-07-20 ENCOUNTER — Encounter: Payer: Self-pay | Admitting: Gastroenterology

## 2016-07-26 ENCOUNTER — Telehealth: Payer: Self-pay | Admitting: Family Medicine

## 2016-07-26 NOTE — Telephone Encounter (Signed)
Wife states pt has not received a copy of recent labs, they would like a copy mailed out asap. Address on file is correct. ep

## 2016-07-27 ENCOUNTER — Encounter: Payer: Self-pay | Admitting: Family Medicine

## 2016-07-27 ENCOUNTER — Other Ambulatory Visit: Payer: Self-pay | Admitting: Family Medicine

## 2016-07-27 DIAGNOSIS — I10 Essential (primary) hypertension: Secondary | ICD-10-CM

## 2016-07-27 NOTE — Telephone Encounter (Signed)
I have contacted patient.  It appears that only his CBC was drawn on 4/9.  He was supposed to have a CMP and Lipid done that day as well.  He will return for labs.

## 2016-08-04 ENCOUNTER — Other Ambulatory Visit: Payer: 59

## 2016-08-04 DIAGNOSIS — I10 Essential (primary) hypertension: Secondary | ICD-10-CM

## 2016-08-05 LAB — SPECIMEN STATUS

## 2016-08-07 ENCOUNTER — Encounter: Payer: Self-pay | Admitting: Family Medicine

## 2016-08-07 ENCOUNTER — Telehealth: Payer: Self-pay | Admitting: Family Medicine

## 2016-08-07 LAB — CMP14+EGFR
ALBUMIN: 4.1 g/dL (ref 3.5–5.5)
ALT: 82 IU/L — ABNORMAL HIGH (ref 0–44)
AST: 86 IU/L — ABNORMAL HIGH (ref 0–40)
Albumin/Globulin Ratio: 1.3 (ref 1.2–2.2)
Alkaline Phosphatase: 103 IU/L (ref 39–117)
BILIRUBIN TOTAL: 0.4 mg/dL (ref 0.0–1.2)
BUN / CREAT RATIO: 13 (ref 9–20)
BUN: 12 mg/dL (ref 6–24)
CALCIUM: 9.2 mg/dL (ref 8.7–10.2)
CHLORIDE: 102 mmol/L (ref 96–106)
CO2: 20 mmol/L (ref 18–29)
Creatinine, Ser: 0.96 mg/dL (ref 0.76–1.27)
GFR, EST AFRICAN AMERICAN: 104 mL/min/{1.73_m2} (ref >59)
GFR, EST NON AFRICAN AMERICAN: 90 mL/min/{1.73_m2} (ref >59)
GLUCOSE: 117 mg/dL — AB (ref 65–99)
Globulin, Total: 3.2 g/dL (ref 1.5–4.5)
Potassium: 4.1 mmol/L (ref 3.5–5.2)
Sodium: 137 mmol/L (ref 134–144)
TOTAL PROTEIN: 7.3 g/dL (ref 6.0–8.5)

## 2016-08-07 LAB — LIPID PANEL
CHOL/HDL RATIO: 3.1 ratio (ref 0.0–5.0)
Cholesterol, Total: 123 mg/dL (ref 100–199)
HDL: 40 mg/dL (ref >39)
LDL CALC: 64 mg/dL (ref 0–99)
Triglycerides: 93 mg/dL (ref 0–149)
VLDL CHOLESTEROL CAL: 19 mg/dL (ref 5–40)

## 2016-08-07 NOTE — Telephone Encounter (Signed)
Discussed labs with patient, who demonstrates persistently elevated LFTs.  He is seeing a gastroenterologist for this.  Will mail copy of labs to patient.  Ruffin Lada M. Lajuana Ripple, DO PGY-3, [facility]

## 2016-10-11 ENCOUNTER — Ambulatory Visit (INDEPENDENT_AMBULATORY_CARE_PROVIDER_SITE_OTHER): Payer: 59 | Admitting: Student

## 2016-10-11 ENCOUNTER — Other Ambulatory Visit: Payer: Self-pay | Admitting: Student

## 2016-10-11 ENCOUNTER — Encounter: Payer: Self-pay | Admitting: Student

## 2016-10-11 VITALS — BP 128/70 | HR 67 | Temp 98.8°F | Ht 71.0 in | Wt 211.4 lb

## 2016-10-11 DIAGNOSIS — M545 Low back pain, unspecified: Secondary | ICD-10-CM

## 2016-10-11 DIAGNOSIS — G8929 Other chronic pain: Secondary | ICD-10-CM

## 2016-10-11 DIAGNOSIS — M25552 Pain in left hip: Secondary | ICD-10-CM

## 2016-10-11 DIAGNOSIS — R748 Abnormal levels of other serum enzymes: Secondary | ICD-10-CM | POA: Diagnosis not present

## 2016-10-11 DIAGNOSIS — M25551 Pain in right hip: Secondary | ICD-10-CM | POA: Diagnosis not present

## 2016-10-11 MED ORDER — TRAMADOL HCL 50 MG PO TABS: 50.0000 mg | ORAL_TABLET | Freq: Three times a day (TID) | ORAL | 0 refills | Status: DC | PRN

## 2016-10-11 MED ORDER — MELOXICAM 15 MG PO TABS: 15.0000 mg | ORAL_TABLET | Freq: Every day | ORAL | 0 refills | Status: DC

## 2016-10-11 NOTE — Progress Notes (Signed)
Subjective:    Eric Nielsen is a 56 y.o. old male here for hip pain  HPI Hip pain: bilateral. This is a chronic issue for years. Has gotten worse over the last 1 week. No inciting factor. No history of trauma or surgery. He has history of osteoarthritis. Was referred to orthopedics in the past. He was given steroid injection which didn't help much. He is not interested in steroid injection today. He describes the pain as "hurting". He couldn't characterize the pain further. Pain is worse with walking and getting up from sitting position. Denies radiation. Denies numbness or tingling in his legs. Denies fever, fatigue, night sweats, unintentional weight loss, bowel or bladder issue or appetite change. He also reports history of back pain and neck pain. He doesn't think his hip pain is related to his back or neck pain. He denies numbness or tingling in his arms.   PMH/Problem List: has Syncope; Abnormal EKG; Special screening for malignant neoplasms, colon; HTN, goal below 140/90; Bilateral hip pain; Trochanteric bursitis of right hip; Lateral epicondylitis of right elbow; Tobacco use disorder; Proctalgia; External hemorrhoid; Chronic bilateral low back pain without sciatica; Breast mass, right; and Elevated liver enzymes on his problem list.   has a past medical history of Abscess, perirectal; Arthritis; HTN (hypertension); and Hyperlipidemia.  FH:  Family History  Problem Relation Age of Onset  . Hypertension Mother   . Cancer Mother        Lung and breast and brain  . Heart failure Father   . Hypertension Father   . Hypertension Brother   . Diabetes Brother   . Colon cancer Neg Hx     SH Social History  Substance Use Topics  . Smoking status: Current Every Day Smoker    Packs/day: 0.50    Years: 39.00    Types: Cigarettes    Start date: 03/20/1976    Last attempt to quit: 01/07/2016  . Smokeless tobacco: Never Used  . Alcohol use 3.6 oz/week    6 Cans of beer per week    Review of  Systems Review of systems negative except for pertinent positives and negatives in history of present illness above.     Objective:     Vitals:   10/11/16 0954  BP: 128/70  Pulse: 67  Temp: 98.8 F (37.1 C)  TempSrc: Oral  SpO2: 93%  Weight: 211 lb 6.4 oz (95.9 kg)  Height: _0  (1.803 m)    Physical Exam GEN: appears well, no apparent distress. CVS: RRR, nl S1&S2, no murmurs, no edema RESP: no IWOB GI: BS present & normal, soft, NTND. MSK:  Hip and lower extremity No bony deformities, inflammation or skin lesion. Mild tenderness to palpation over greater trochanters bilaterally. ROM upon flexion, extension, abduction, & adduction. Full strength in hip and knee flexors and extensors, hip adductors and abductors. Positive FABER and FADIR bilaterally.  Crossed and straight leg-raising sign negative. Not able to elicit patellar reflexes bilaterally. Otherwise, his neuro exam is grossly intact.  SKIN: no apparent skin lesion NEURO: alert and oiented appropriately, no gross deficits  PSYCH: euthymic mood with congruent affect    Assessment and Plan:  Bilateral hip pain Likely OA given positive FABER and FADIR. This was noted on his hip X-ray two weeks ago. He has no constitutional symptoms nor neuro deficit on exam. No red flags either. He has mild tenderness over trochanteric bursitis on exam but don't think this is the main cause of his pain. I think he  will benefit from ortho evaluation. So will order referral. Gave Rx for tramadol and Mobic.   Elevated liver enzymes Noted that his liver enzymes were elevated about two months ago. Denies history of heavy drinking and GI symptoms. Will repeat CMP today. If LFT continues to be high, will proceed with further work up.   Orders Placed This Encounter  Procedures  . CMP14+EGFR  . Ambulatory referral to Orthopedic Surgery   Meds ordered this encounter  Medications  . traMADol (ULTRAM) 50 MG tablet    Sig: Take 1 tablet (50 mg  total) by mouth every 8 (eight) hours as needed for moderate pain or severe pain.    Dispense:  30 tablet    Refill:  0  . meloxicam (MOBIC) 15 MG tablet    Sig: Take 1 tablet (15 mg total) by mouth daily.    Dispense:  30 tablet    Refill:  0   Return if symptoms worsen or fail to improve.  Mercy Riding, MD 10/11/16 Pager: [phone]

## 2016-10-11 NOTE — Assessment & Plan Note (Signed)
Noted that his liver enzymes were elevated about two months ago. Denies history of heavy drinking and GI symptoms. Will repeat CMP today. If LFT continues to be high, will proceed with further work up.

## 2016-10-11 NOTE — Assessment & Plan Note (Signed)
Likely OA given positive FABER and FADIR. This was noted on his hip X-ray two weeks ago. He has no constitutional symptoms nor neuro deficit on exam. No red flags either. He has mild tenderness over trochanteric bursitis on exam but don't think this is the main cause of his pain. I think he will benefit from ortho evaluation. So will order referral. Gave Rx for tramadol and Mobic.

## 2016-10-11 NOTE — Patient Instructions (Signed)
It was great seeing you today! We have addressed the following issues today 1. Hip pain: we have refilled a prescription for meloxicam and tramadol. We have also sent a referral to orthopedics for further evaluation and management. Someone will get in touch with you over the next couple of weeks about this referral.  If we did any lab work today, and the results require attention, either me or my nurse will get in touch with you. If everything is normal, you will get a letter in mail and a message via . If you don't hear from [place] in two weeks, please give [place] a call. Otherwise, we look forward to seeing you again at your next visit. If you have any questions or concerns before then, please call the clinic at [phone].  Please bring all your medications to every doctors visit  Sign up for My Chart to have easy access to your labs results, and communication with your Primary care physician.    Please check-out at the front desk before leaving the clinic.    Take Care,   Dr. Cyndia Skeeters

## 2016-10-12 LAB — CMP14+EGFR
ALK PHOS: 98 IU/L (ref 39–117)
ALT: 36 IU/L (ref 0–44)
AST: 38 IU/L (ref 0–40)
Albumin/Globulin Ratio: 1.3 (ref 1.2–2.2)
Albumin: 4.3 g/dL (ref 3.5–5.5)
BUN/Creatinine Ratio: 9 (ref 9–20)
BUN: 9 mg/dL (ref 6–24)
Bilirubin Total: 0.3 mg/dL (ref 0.0–1.2)
CALCIUM: 9.7 mg/dL (ref 8.7–10.2)
CO2: 22 mmol/L (ref 20–29)
CREATININE: 0.95 mg/dL (ref 0.76–1.27)
Chloride: 102 mmol/L (ref 96–106)
GFR calc Af Amer: 105 mL/min/{1.73_m2} (ref >59)
GFR, EST NON AFRICAN AMERICAN: 91 mL/min/{1.73_m2} (ref >59)
GLOBULIN, TOTAL: 3.3 g/dL (ref 1.5–4.5)
GLUCOSE: 108 mg/dL — AB (ref 65–99)
Potassium: 4 mmol/L (ref 3.5–5.2)
SODIUM: 139 mmol/L (ref 134–144)
Total Protein: 7.6 g/dL (ref 6.0–8.5)

## 2017-04-25 ENCOUNTER — Other Ambulatory Visit: Payer: Self-pay | Admitting: Family Medicine

## 2017-04-25 DIAGNOSIS — I10 Essential (primary) hypertension: Secondary | ICD-10-CM

## 2017-05-01 ENCOUNTER — Other Ambulatory Visit: Payer: Self-pay

## 2017-05-01 DIAGNOSIS — I10 Essential (primary) hypertension: Secondary | ICD-10-CM

## 2017-05-01 MED ORDER — AMLODIPINE BESYLATE 10 MG PO TABS: 10.0000 mg | ORAL_TABLET | Freq: Every day | ORAL | 3 refills | Status: DC

## 2017-05-01 NOTE — Telephone Encounter (Signed)
Arbutus Ped left message on nurse line for refill Amlodipine. Send to [facility] on [address]. Danley Danker, RN [facility] RN)

## 2017-05-25 ENCOUNTER — Other Ambulatory Visit: Payer: Self-pay | Admitting: Family Medicine

## 2017-06-12 ENCOUNTER — Encounter: Payer: Self-pay | Admitting: Student

## 2017-06-13 ENCOUNTER — Ambulatory Visit: Payer: 59 | Admitting: Student

## 2017-06-13 ENCOUNTER — Encounter: Payer: Self-pay | Admitting: Student

## 2017-06-13 ENCOUNTER — Other Ambulatory Visit: Payer: Self-pay

## 2017-06-13 VITALS — BP 140/74 | HR 68 | Temp 98.5°F | Ht 71.0 in | Wt 206.8 lb

## 2017-06-13 DIAGNOSIS — I10 Essential (primary) hypertension: Secondary | ICD-10-CM | POA: Diagnosis not present

## 2017-06-13 DIAGNOSIS — Z0001 Encounter for general adult medical examination with abnormal findings: Secondary | ICD-10-CM

## 2017-06-13 DIAGNOSIS — G8929 Other chronic pain: Secondary | ICD-10-CM | POA: Diagnosis not present

## 2017-06-13 DIAGNOSIS — Z862 Personal history of diseases of the blood and blood-forming organs and certain disorders involving the immune mechanism: Secondary | ICD-10-CM

## 2017-06-13 DIAGNOSIS — M545 Low back pain: Secondary | ICD-10-CM | POA: Diagnosis not present

## 2017-06-13 DIAGNOSIS — Z23 Encounter for immunization: Secondary | ICD-10-CM

## 2017-06-13 DIAGNOSIS — F17200 Nicotine dependence, unspecified, uncomplicated: Secondary | ICD-10-CM | POA: Diagnosis not present

## 2017-06-13 DIAGNOSIS — Z0000 Encounter for general adult medical examination without abnormal findings: Secondary | ICD-10-CM | POA: Insufficient documentation

## 2017-06-13 DIAGNOSIS — Z789 Other specified health status: Secondary | ICD-10-CM | POA: Diagnosis not present

## 2017-06-13 DIAGNOSIS — Z7289 Other problems related to lifestyle: Secondary | ICD-10-CM

## 2017-06-13 MED ORDER — AMLODIPINE BESYLATE 10 MG PO TABS: 10.0000 mg | ORAL_TABLET | Freq: Every day | ORAL | 3 refills | Status: DC

## 2017-06-13 MED ORDER — ATORVASTATIN CALCIUM 40 MG PO TABS: 40.0000 mg | ORAL_TABLET | Freq: Every day | ORAL | 3 refills | Status: DC

## 2017-06-13 MED ORDER — LISINOPRIL 40 MG PO TABS: ORAL_TABLET | ORAL | 12 refills | Status: DC

## 2017-06-13 MED ORDER — NICOTINE 21 MG/24HR TD PT24: 21.0000 mg | MEDICATED_PATCH | Freq: Every day | TRANSDERMAL | 0 refills | Status: DC

## 2017-06-13 NOTE — Progress Notes (Signed)
Subjective:   Chief Complaint  Patient presents with  . Annual Exam   HPI Eric Nielsen is a 57 y.o. old male here  for annual exam.  Concern today:  Smoking: likes to quit. Interested in patches. Smokes about a pack a day for 20 years. Worried about having cancer because it runs in his family. Mother with breast cancer at about 53 years. Uncle with lung cancer when he was about 34 years. Concern level 8/10. Confidence level 10/10. Not interested in counselor Changes in his/her health in the last 12 months: no Occupation: applying for disability due to back pain and hip pain Wears seatbelt: yes.    The patient has regular exercise: walks about three days a week for 30 minutes. Limited by back pain. Enough vegetables and fruits: yes.  Smokes cigarette: yes Drinks EtOH: yes. About 6 cans last night. Counseled. Drug use: no Ever been transfused or tattooed?: no.  The patient is sexually active. Monogamous Advance directive: no. History of depression:no.  Patient dental home: no. Reccommended    Immunizations  Needs influenza vaccine: no.  Needs Tdap: no.  Needs Pneumococcal: yes.  Screening Need colon cancer screening: no. STOP BANG >/=3 for OSA: no. Need lung cancer screening (men > 55):no. Need AAA screening (men 65-74, >100 cigarettes):not applicable At risk for skin cancer: no. Need HCV Screening: no. Need STI Screening: no. Fall in the last 12 months:no  PMH/Problem List: has Syncope; Abnormal EKG; Special screening for malignant neoplasms, colon; Hypertension goal BP (blood pressure) < 130/80; Bilateral hip pain; Trochanteric bursitis of right hip; Lateral epicondylitis of right elbow; Tobacco use disorder; Proctalgia; External hemorrhoid; Chronic bilateral low back pain without sciatica; Breast mass, right; Elevated liver enzymes; Annual physical exam; History of polycythemia; and Alcohol use on their problem list.   has a past medical history of Abscess, perirectal,  Arthritis, HTN (hypertension), and Hyperlipidemia.  [facility]  Family History  Problem Relation Age of Onset  . Hypertension Mother   . Cancer Mother        Lung and breast and brain  . Heart failure Father   . Hypertension Father   . Hypertension Brother   . Diabetes Brother   . Colon cancer Neg Hx    Family history of heart disease before age of 45 yrs: no. Family history of stroke: no. Family history of cancer: yes as above  SH Social History   Tobacco Use  . Smoking status: Current Every Day Smoker    Packs/day: 0.50    Years: 39.00    Pack years: 19.50    Types: Cigarettes    Start date: 03/20/1976    Last attempt to quit: 01/07/2016    Years since quitting: 1.4  . Smokeless tobacco: Never Used  Substance Use Topics  . Alcohol use: Yes    Alcohol/week: 3.6 oz    Types: 6 Cans of beer per week  . Drug use: No     Review of Systems  Constitutional: Negative for fatigue, fever and unexpected weight change.  HENT: Negative for congestion, hearing loss and trouble swallowing.   Eyes: Negative for visual disturbance.  Respiratory: Negative for cough, chest tightness and shortness of breath.   Cardiovascular: Negative for chest pain, palpitations and leg swelling.  Gastrointestinal: Negative for abdominal pain, blood in stool and diarrhea.  Endocrine: Negative for cold intolerance and heat intolerance.  Genitourinary: Negative for dysuria, frequency, hematuria and scrotal swelling.  Musculoskeletal: Positive for arthralgias. Negative for myalgias.  Skin: Negative  for rash.  Neurological: Negative for dizziness, light-headedness and headaches.  Hematological: Negative for adenopathy. Does not bruise/bleed easily.  Psychiatric/Behavioral: Negative for dysphoric mood. The patient is not nervous/anxious.        Objective:   Physical Exam Vitals:   06/13/17 0856 06/13/17 1116  BP: (!) 146/78 140/74  Pulse: 68   Temp: 98.5 F (36.9 C)   TempSrc: Oral   SpO2: 98%     Weight: 206 lb 12.8 oz (93.8 kg)   Height: 5\' 11"  (1.803 m)    Body mass index is 28.84 kg/m.  GEN: appears well, no apparent distress. Head: normocephalic and atraumatic  Eyes: conjunctiva without injection, sclera anicteric Ears: external ear and ear canal normal Oropharynx: mmm without erythema or exudation.  No dentition. HEM: negative for cervical or periauricular lymphadenopathies CVS: RRR, nl s1 & s2, no murmurs, no edema,  2+ DP & PT bil RESP: no IWOB, good air movement bilaterally, CTAB GI: BS present & normal, soft, NTND, no guarding, no rebound, no mass GU: no suprapubic or CVA tenderness. No palpable breast mass.  MSK: no focal tenderness or notable swelling SKIN: no apparent skin lesion ENDO: negative thyromegally NEURO: alert and oiented appropriately, no gross deficits  PSYCH: euthymic mood with congruent affect    Assessment & Plan:  1. Annual physical exam: history, problem list, medication and allergies updated in his chart.  Lifestyle change including diet and exercise discussed.  Handout given.  Received PPSV-23 given history of smoking.  Advanced directive discussed.  Information packet provided.  Otherwise, up-to-date on his health maintenance.  Follow-up in 1 year or sooner if needed.  2. History of polycythemia: hematocrit as high as 54.3 about 4 years ago.  Most recent hematocrit 46.3 about a year ago.  He has no constitutional symptoms.  He has mild thrombocytopenia to 121.  Wonder if this is due to tobacco use although thrombocytopenia doesn't fit in.  - CBC with Differential  3. Tobacco use disorder: Interested in quitting.  Concern level 8/10.  Confidence level 10/10.  Interested in nicotine patch.  Also gave him quit line number.  Follow-up in 1 month.  - nicotine (NICODERM CQ - DOSED IN MG/24 HOURS) 21 mg/24hr patch; Place 1 patch (21 mg total) onto the skin daily.  Dispense: 28 patch; Refill: 0   4. Hypertension goal BP (blood pressure) < 130/80:  Suboptimally controlled.  Blood pressure 140/74.  Discussed lifestyle changes above.  We will continue his amlodipine and lisinopril.  He has had normal CMP recently. - amLODipine (NORVASC) 10 MG tablet; Take 1 tablet (10 mg total) by mouth daily.  Dispense: 90 tablet; Refill: 3 - atorvastatin (LIPITOR) 40 MG tablet; Take 1 tablet (40 mg total) by mouth daily.  Dispense: 90 tablet; Refill: 3 - lisinopril (PRINIVIL,ZESTRIL) 40 MG tablet; TAKE 1 TABLET(40 MG) BY MOUTH DAILY  Dispense: 30 tablet; Refill: 12  5. Chronic bilateral low back pain without sciatica: Chronic issue.  No red flags.  Not interested in pain medication but reports taking Advil occasionally.  Recommended against over-the-counter pain medication except Tylenol given his history of hypertension.  He declined tramadol.  6.  Alcohol use: recommended moderation.  Follow-up in 1 month for smoking cessation.  Wendee Beavers PGY-3 Pager [phone] 06/13/17  2:40 PM

## 2017-06-13 NOTE — Patient Instructions (Signed)
It was great seeing you today! We have addressed the following issues today  Smoking: I am glad you thinking about quitting smoking.  We send a nicotine patch to your pharmacy.  I also recommend calling 1800-QUIT-NOW for help with stopping smoking.  Follow-up in 1 month.   Hypertension/blood pressure: we refilled your blood pressure medications.  Please avoid over-the-counter pain medications except Tylenol.  You can take Tylenol every 6 hours.   Health Maintenance, Male A healthy lifestyle and preventive care is important for your health and wellness. Ask your health care provider about what schedule of regular examinations is right for you. What should I know about weight and diet? Eat a Healthy Diet  Eat plenty of vegetables, fruits, whole grains, low-fat dairy products, and lean protein.  Do not eat a lot of foods high in solid fats, added sugars, or salt.  Maintain a Healthy Weight Regular exercise can help you achieve or maintain a healthy weight. You should:  Do at least 150 minutes of exercise each week. The exercise should increase your heart rate and make you sweat (moderate-intensity exercise).  Do strength-training exercises at least twice a week.  Watch Your Levels of Cholesterol and Blood Lipids  Have your blood tested for lipids and cholesterol every 5 years starting at 55 years of age. If you are at high risk for heart disease, you should start having your blood tested when you are 55 years old. You may need to have your cholesterol levels checked more often if: ? Your lipid or cholesterol levels are high. ? You are older than 55 years of age. ? You are at high risk for heart disease.  What should I know about cancer screening? Many types of cancers can be detected early and may often be prevented. Lung Cancer  You should be screened every year for lung cancer if: ? You are a current smoker who has smoked for at least 30 years. ? You are a former smoker who has  quit within the past 15 years.  Talk to your health care provider about your screening options, when you should start screening, and how often you should be screened.  Colorectal Cancer  Routine colorectal cancer screening usually begins at 55 years of age and should be repeated every 5-10 years until you are 55 years old. You may need to be screened more often if early forms of precancerous polyps or small growths are found. Your health care provider may recommend screening at an earlier age if you have risk factors for colon cancer.  Your health care provider may recommend using home test kits to check for hidden blood in the stool.  A small camera at the end of a tube can be used to examine your colon (sigmoidoscopy or colonoscopy). This checks for the earliest forms of colorectal cancer.  Prostate and Testicular Cancer  Depending on your age and overall health, your health care provider may do certain tests to screen for prostate and testicular cancer.  Talk to your health care provider about any symptoms or concerns you have about testicular or prostate cancer.  Skin Cancer  Check your skin from head to toe regularly.  Tell your health care provider about any new moles or changes in moles, especially if: ? There is a change in a mole's size, shape, or color. ? You have a mole that is larger than a pencil eraser.  Always use sunscreen. Apply sunscreen liberally and repeat throughout the day.  Protect yourself  by wearing long sleeves, pants, a wide-brimmed hat, and sunglasses when outside.  What should I know about heart disease, diabetes, and high blood pressure?  If you are 58-25 years of age, have your blood pressure checked every 3-5 years. If you are 74 years of age or older, have your blood pressure checked every year. You should have your blood pressure measured twice-once when you are at a hospital or clinic, and once when you are not at a hospital or clinic. Record the  average of the two measurements. To check your blood pressure when you are not at a hospital or clinic, you can use: ? An automated blood pressure machine at a pharmacy. ? A home blood pressure monitor.  Talk to your health care provider about your target blood pressure.  If you are between 42-66 years old, ask your health care provider if you should take aspirin to prevent heart disease.  Have regular diabetes screenings by checking your fasting blood sugar level. ? If you are at a normal weight and have a low risk for diabetes, have this test once every three years after the age of 41. ? If you are overweight and have a high risk for diabetes, consider being tested at a younger age or more often.  A one-time screening for abdominal aortic aneurysm (AAA) by ultrasound is recommended for men aged 54-75 years who are current or former smokers. What should I know about preventing infection? Hepatitis B If you have a higher risk for hepatitis B, you should be screened for this virus. Talk with your health care provider to find out if you are at risk for hepatitis B infection. Hepatitis C Blood testing is recommended for:  Everyone born from [birth year] through 1963.  Anyone with known risk factors for hepatitis C.  Sexually Transmitted Diseases (STDs)  You should be screened each year for STDs including gonorrhea and chlamydia if: ? You are sexually active and are younger than 55 years of age. ? You are older than 55 years of age and your health care provider tells you that you are at risk for this type of infection. ? Your sexual activity has changed since you were last screened and you are at an increased risk for chlamydia or gonorrhea. Ask your health care provider if you are at risk.  Talk with your health care provider about whether you are at high risk of being infected with HIV. Your health care provider may recommend a prescription medicine to help prevent HIV infection.  What else can  I do?  Schedule regular health, dental, and eye exams.  Stay current with your vaccines (immunizations).  Do not use any tobacco products, such as cigarettes, chewing tobacco, and e-cigarettes. If you need help quitting, ask your health care provider.  Limit alcohol intake to no more than 2 drinks per day. One drink equals 12 ounces of beer, 5 ounces of wine, or 1 ounces of hard liquor.  Do not use street drugs.  Do not share needles.  Ask your health care provider for help if you need support or information about quitting drugs.  Tell your health care provider if you often feel depressed.  Tell your health care provider if you have ever been abused or do not feel safe at home. This information is not intended to replace advice given to you by your health care provider. Make sure you discuss any questions you have with your health care provider. Document Released: 09/02/2007 Document Revised: 11/03/2015  Document Reviewed: 12/08/2014 Elsevier Interactive Patient Education  [employer].

## 2017-06-14 ENCOUNTER — Telehealth: Payer: Self-pay

## 2017-06-14 LAB — CBC WITH DIFFERENTIAL/PLATELET
BASOS: 1 %
Basophils Absolute: 0 10*3/uL (ref 0.0–0.2)
EOS (ABSOLUTE): 0.1 10*3/uL (ref 0.0–0.4)
Eos: 2 %
Hematocrit: 50.6 % (ref 37.5–51.0)
Hemoglobin: 17.5 g/dL (ref 13.0–17.7)
IMMATURE GRANULOCYTES: 0 %
Immature Grans (Abs): 0 10*3/uL (ref 0.0–0.1)
Lymphocytes Absolute: 1.6 10*3/uL (ref 0.7–3.1)
Lymphs: 45 %
MCH: 33.2 pg — ABNORMAL HIGH (ref 26.6–33.0)
MCHC: 34.6 g/dL (ref 31.5–35.7)
MCV: 96 fL (ref 79–97)
MONOS ABS: 0.6 10*3/uL (ref 0.1–0.9)
Monocytes: 16 %
NEUTROS PCT: 36 %
Neutrophils Absolute: 1.3 10*3/uL — ABNORMAL LOW (ref 1.4–7.0)
Platelets: 122 10*3/uL — ABNORMAL LOW (ref 150–379)
RBC: 5.27 x10E6/uL (ref 4.14–5.80)
RDW: 13.4 % (ref 12.3–15.4)
WBC: 3.5 10*3/uL (ref 3.4–10.8)

## 2017-06-14 NOTE — Telephone Encounter (Signed)
Received fax from [place] requesting prior authorization of Nicotine patches.  PA started via phone with [employer] because CoverMyMeds could not locate patient. Status pending. Will fax response within a few days.  Reference # [id number].  Danley Danker, RN [facility] RN)

## 2017-06-15 NOTE — Telephone Encounter (Signed)
Prior approval for nicotine patches completed via telephone. Fax approval received.  Med approved for 06/15/2017 - 06/15/2018. [facility] pharmacy informed. Danley Danker, RN [facility] RN)

## 2018-07-05 ENCOUNTER — Other Ambulatory Visit: Payer: Self-pay

## 2018-07-05 DIAGNOSIS — F17200 Nicotine dependence, unspecified, uncomplicated: Secondary | ICD-10-CM

## 2018-07-05 DIAGNOSIS — I10 Essential (primary) hypertension: Secondary | ICD-10-CM

## 2018-07-05 MED ORDER — LISINOPRIL 40 MG PO TABS: ORAL_TABLET | ORAL | 12 refills | Status: DC

## 2018-07-05 MED ORDER — ATORVASTATIN CALCIUM 40 MG PO TABS: 40.0000 mg | ORAL_TABLET | Freq: Every day | ORAL | 3 refills | Status: DC

## 2018-07-24 ENCOUNTER — Emergency Department (HOSPITAL_COMMUNITY)
Admission: EM | Admit: 2018-07-24 | Discharge: 2018-07-24 | Disposition: A | Discharge status: Home / Self Care | Payer: Self-pay | Attending: Emergency Medicine | Admitting: Emergency Medicine

## 2018-07-24 ENCOUNTER — Encounter (HOSPITAL_COMMUNITY): Payer: Self-pay | Admitting: Emergency Medicine

## 2018-07-24 ENCOUNTER — Other Ambulatory Visit: Payer: Self-pay

## 2018-07-24 ENCOUNTER — Emergency Department (HOSPITAL_COMMUNITY): Payer: Self-pay

## 2018-07-24 DIAGNOSIS — R55 Syncope and collapse: Secondary | ICD-10-CM | POA: Insufficient documentation

## 2018-07-24 DIAGNOSIS — Z79899 Other long term (current) drug therapy: Secondary | ICD-10-CM | POA: Insufficient documentation

## 2018-07-24 DIAGNOSIS — I10 Essential (primary) hypertension: Secondary | ICD-10-CM | POA: Insufficient documentation

## 2018-07-24 DIAGNOSIS — F17210 Nicotine dependence, cigarettes, uncomplicated: Secondary | ICD-10-CM | POA: Insufficient documentation

## 2018-07-24 HISTORY — DX: Hyperlipidemia, unspecified: E78.5

## 2018-07-24 HISTORY — DX: Essential (primary) hypertension: I10

## 2018-07-24 LAB — COMPREHENSIVE METABOLIC PANEL
ALT: 61 U/L — ABNORMAL HIGH (ref 0–44)
AST: 90 U/L — ABNORMAL HIGH (ref 15–41)
Albumin: 3.7 g/dL (ref 3.5–5.0)
Alkaline Phosphatase: 79 U/L (ref 38–126)
Anion gap: 16 — ABNORMAL HIGH (ref 5–15)
BUN: 6 mg/dL (ref 6–20)
CO2: 24 mmol/L (ref 22–32)
Calcium: 9 mg/dL (ref 8.9–10.3)
Chloride: 94 mmol/L — ABNORMAL LOW (ref 98–111)
Creatinine, Ser: 1.22 mg/dL (ref 0.61–1.24)
GFR calc Af Amer: >60 mL/min (ref >60)
GFR calc non Af Amer: >60 mL/min (ref >60)
Glucose, Bld: 148 mg/dL — ABNORMAL HIGH (ref 70–99)
Potassium: 3.2 mmol/L — ABNORMAL LOW (ref 3.5–5.1)
Sodium: 134 mmol/L — ABNORMAL LOW (ref 135–145)
Total Bilirubin: 0.7 mg/dL (ref 0.3–1.2)
Total Protein: 7.8 g/dL (ref 6.5–8.1)

## 2018-07-24 LAB — CBC WITH DIFFERENTIAL/PLATELET
Abs Immature Granulocytes: 0.02 10*3/uL (ref 0.00–0.07)
Basophils Absolute: 0.1 10*3/uL (ref 0.0–0.1)
Basophils Relative: 1 %
Eosinophils Absolute: 0.1 10*3/uL (ref 0.0–0.5)
Eosinophils Relative: 1 %
HCT: 46.4 % (ref 39.0–52.0)
Hemoglobin: 16.5 g/dL (ref 13.0–17.0)
Immature Granulocytes: 0 %
Lymphocytes Relative: 34 %
Lymphs Abs: 2.3 10*3/uL (ref 0.7–4.0)
MCH: 34.2 pg — ABNORMAL HIGH (ref 26.0–34.0)
MCHC: 35.6 g/dL (ref 30.0–36.0)
MCV: 96.1 fL (ref 80.0–100.0)
Monocytes Absolute: 0.8 10*3/uL (ref 0.1–1.0)
Monocytes Relative: 12 %
Neutro Abs: 3.5 10*3/uL (ref 1.7–7.7)
Neutrophils Relative %: 52 %
Platelets: 123 10*3/uL — ABNORMAL LOW (ref 150–400)
RBC: 4.83 MIL/uL (ref 4.22–5.81)
RDW: 12.3 % (ref 11.5–15.5)
WBC: 6.7 10*3/uL (ref 4.0–10.5)
nRBC: 0 % (ref 0.0–0.2)

## 2018-07-24 LAB — URINALYSIS, ROUTINE W REFLEX MICROSCOPIC
Bilirubin Urine: NEGATIVE
Glucose, UA: NEGATIVE mg/dL
Hgb urine dipstick: NEGATIVE
Ketones, ur: NEGATIVE mg/dL
Leukocytes,Ua: NEGATIVE
Nitrite: NEGATIVE
Protein, ur: NEGATIVE mg/dL
Specific Gravity, Urine: 1.003 — ABNORMAL LOW (ref 1.005–1.030)
pH: 5 (ref 5.0–8.0)

## 2018-07-24 LAB — TROPONIN I: Troponin I: <0.03 ng/mL (ref <0.03)

## 2018-07-24 MED ORDER — SODIUM CHLORIDE 0.9 % IV BOLUS
1000.0000 mL | Freq: Once | INTRAVENOUS | Status: AC
  Administered 2018-07-24: 1000 mL via INTRAVENOUS

## 2018-07-24 MED ORDER — SODIUM CHLORIDE 0.9 % IV SOLN
INTRAVENOUS | Status: DC
  Administered 2018-07-24: 22:00:00 via INTRAVENOUS

## 2018-07-24 NOTE — ED Notes (Signed)
Patient transported to CT 

## 2018-07-24 NOTE — ED Provider Notes (Signed)
[facility] EMERGENCY DEPARTMENT Provider Note   CSN: [id number] Arrival date & time: 07/24/18  2023    History   Chief Complaint Chief Complaint  Patient presents with  . Loss of Consciousness    HPI Eric Nielsen is a 58 y.o. male.     Pt presents to the ED today with a syncopal episode.  The pt said he was sitting on his couch when he suddenly passed out.  He denies feeling bad before this happened.  It was witnessed by a sig other.  It lasted for a few minutes.  The pt does not remember what happened.  He has been well until now and denies any known sick contacts, specifically Covid.     Past Medical History:  Diagnosis Date  . Hyperlipidemia   . Hypertension     There are no active problems to display for this patient.   History reviewed. No pertinent surgical history.      Home Medications    Prior to Admission medications   Medication Sig Start Date End Date Taking? Authorizing Provider  atorvastatin (LIPITOR) 40 MG tablet Take 40 mg by mouth daily. 05/13/18   [provider]  lisinopril (ZESTRIL) 40 MG tablet Take 40 mg by mouth daily. 04/30/18   [provider]    Family History History reviewed. No pertinent family history.  Social History Social History   Tobacco Use  . Smoking status: Heavy Tobacco Smoker    Packs/day: 1.00  . Smokeless tobacco: Never Used  Substance Use Topics  . Alcohol use: Yes    Alcohol/week: 4.0 standard drinks    Types: 4 Cans of beer per week  . Drug use: Never     Allergies   Patient has no allergy information on record.   Review of Systems Review of Systems  Neurological: Positive for syncope.  All other systems reviewed and are negative.    Physical Exam Updated Vital Signs BP 122/68   Pulse 72   Temp 98.8 F (37.1 C) (Oral)   Resp 20   Ht 5\' 11"  (1.803 m)   Wt 90.7 kg   SpO2 98%   BMI 27.89 kg/m   Physical Exam Vitals signs and nursing note reviewed.   Constitutional:      Appearance: Normal appearance.  HENT:     Head: Normocephalic and atraumatic.     Right Ear: External ear normal.     Left Ear: External ear normal.     Nose: Nose normal.     Mouth/Throat:     Mouth: Mucous membranes are moist.     Pharynx: Oropharynx is clear.  Eyes:     Extraocular Movements: Extraocular movements intact.     Conjunctiva/sclera: Conjunctivae normal.     Pupils: Pupils are equal, round, and reactive to light.  Neck:     Musculoskeletal: Normal range of motion and neck supple.  Cardiovascular:     Rate and Rhythm: Normal rate and regular rhythm.     Pulses: Normal pulses.     Heart sounds: Normal heart sounds.  Pulmonary:     Effort: Pulmonary effort is normal.     Breath sounds: Normal breath sounds.  Abdominal:     General: Abdomen is flat. Bowel sounds are normal.     Palpations: Abdomen is soft.  Musculoskeletal: Normal range of motion.  Skin:    General: Skin is warm.     Capillary Refill: Capillary refill takes less than 2 seconds.  Neurological:  General: No focal deficit present.     Mental Status: He is alert and oriented to person, place, and time.  Psychiatric:        Mood and Affect: Mood normal.        Behavior: Behavior normal.      ED Treatments / Results  Labs (all labs ordered are listed, but only abnormal results are displayed) Labs Reviewed  CBC WITH DIFFERENTIAL/PLATELET - Abnormal; Notable for the following components:      Result Value   MCH 34.2 (*)    Platelets 123 (*)    All other components within normal limits  COMPREHENSIVE METABOLIC PANEL - Abnormal; Notable for the following components:   Sodium 134 (*)    Potassium 3.2 (*)    Chloride 94 (*)    Glucose, Bld 148 (*)    AST 90 (*)    ALT 61 (*)    Anion gap 16 (*)    All other components within normal limits  TROPONIN I  URINALYSIS, ROUTINE W REFLEX MICROSCOPIC  RAPID URINE DRUG SCREEN, HOSP PERFORMED  CBG MONITORING, ED    EKG EKG  Interpretation  Date/Time:  Wednesday Jul 24 2018 20:27:37 EDT Ventricular Rate:  64 PR Interval:    QRS Duration: 100 QT Interval:  412 QTC Calculation: 426 R Axis:   6 Text Interpretation:  Sinus rhythm Probable left ventricular hypertrophy Anterior Q waves, possibly due to LVH No old tracing to compare Confirmed by Isla Pence [phone]) on 07/24/2018 8:34:55 PM   Radiology Ct Head Wo Contrast  Result Date: 07/24/2018 CLINICAL DATA:  Altered LOC EXAM: CT HEAD WITHOUT CONTRAST TECHNIQUE: Contiguous axial images were obtained from the base of the skull through the vertex without intravenous contrast. COMPARISON:  None. FINDINGS: Brain: No evidence of acute infarction, hemorrhage, hydrocephalus, extra-axial collection or mass lesion/mass effect. Vascular: No hyperdense vessel. Scattered calcifications at the carotid siphons Skull: Normal. Negative for fracture or focal lesion. Sinuses/Orbits: Mucosal thickening in the ethmoid sinuses Other: None IMPRESSION: Negative non contrasted CT appearance of the brain Electronically Signed   By: Donavan Foil M.D.   On: 07/24/2018 21:20   Dg Chest Port 1 View  Result Date: 07/24/2018 CLINICAL DATA:  Syncope EXAM: PORTABLE CHEST 1 VIEW COMPARISON:  11/24/2005 FINDINGS: Heart size upper normal. No acute consolidation or effusion. No pneumothorax. IMPRESSION: No active disease. Electronically Signed   By: Donavan Foil M.D.   On: 07/24/2018 21:20    Procedures Procedures (including critical care time)  Medications Ordered in ED Medications  sodium chloride 0.9 % bolus 1,000 mL (1,000 mLs Intravenous New Bag/Given 07/24/18 2039)    And  0.9 %  sodium chloride infusion ( Intravenous New Bag/Given 07/24/18 2159)     Initial Impression / Assessment and Plan / ED Course  I have reviewed the triage vital signs and the nursing notes.  Pertinent labs & imaging results that were available during my care of the patient were reviewed by me and considered in my  medical decision making (see chart for details).       Labs and CXR reviewed.  I offered pt admission for syncope, but he prefers to go home.  He does have good follow up with his pcp.  He last saw him on 3/27 for an annual visit.  Pt instructed to f/u with pcp.  He is also given a referral to cardiology for syncope.  Return if worse.  Final Clinical Impressions(s) / ED Diagnoses   Final diagnoses:  Syncope, unspecified syncope type    ED Discharge Orders         Ordered    Ambulatory referral to Cardiology     07/24/18 2251           Isla Pence, MD 07/24/18 2253

## 2018-07-24 NOTE — ED Triage Notes (Signed)
Per EMS, Pt is from home. Family said they witnessed pt have a syncopal episode on the couch for about 3-5 seconds. Pt denies CP/N/V/D, cough and fever. Pt had 324 ASA PTA with an 18g in the LAC.

## 2018-07-25 ENCOUNTER — Encounter: Payer: Self-pay | Admitting: Student

## 2018-07-25 LAB — RAPID URINE DRUG SCREEN, HOSP PERFORMED
Amphetamines: NOT DETECTED
Barbiturates: NOT DETECTED
Benzodiazepines: NOT DETECTED
Cocaine: NOT DETECTED
Opiates: NOT DETECTED
Tetrahydrocannabinol: POSITIVE — AB

## 2019-05-20 ENCOUNTER — Ambulatory Visit: Payer: Self-pay | Admitting: Family Medicine

## 2019-05-28 ENCOUNTER — Ambulatory Visit (INDEPENDENT_AMBULATORY_CARE_PROVIDER_SITE_OTHER): Payer: Self-pay | Admitting: Family Medicine

## 2019-05-28 ENCOUNTER — Other Ambulatory Visit: Payer: Self-pay

## 2019-05-28 ENCOUNTER — Encounter: Payer: Self-pay | Admitting: Family Medicine

## 2019-05-28 ENCOUNTER — Ambulatory Visit (HOSPITAL_COMMUNITY)
Admission: RE | Admit: 2019-05-28 | Discharge: 2019-05-28 | Disposition: A | Discharge status: Home / Self Care | Payer: Self-pay | Source: Ambulatory Visit | Attending: Family Medicine | Admitting: Family Medicine

## 2019-05-28 VITALS — BP 218/100 | HR 75 | Wt 198.2 lb

## 2019-05-28 DIAGNOSIS — R011 Cardiac murmur, unspecified: Secondary | ICD-10-CM | POA: Insufficient documentation

## 2019-05-28 DIAGNOSIS — R739 Hyperglycemia, unspecified: Secondary | ICD-10-CM | POA: Insufficient documentation

## 2019-05-28 DIAGNOSIS — I4510 Unspecified right bundle-branch block: Secondary | ICD-10-CM | POA: Insufficient documentation

## 2019-05-28 DIAGNOSIS — I10 Essential (primary) hypertension: Secondary | ICD-10-CM | POA: Insufficient documentation

## 2019-05-28 DIAGNOSIS — E785 Hyperlipidemia, unspecified: Secondary | ICD-10-CM | POA: Insufficient documentation

## 2019-05-28 DIAGNOSIS — F17200 Nicotine dependence, unspecified, uncomplicated: Secondary | ICD-10-CM

## 2019-05-28 NOTE — Progress Notes (Signed)
SUBJECTIVE:   CHIEF COMPLAINT / HPI:   Patient has no physical complaints at this time, he says he is here for physical.  He says he is coming in because "my wife said I have to check my cholesterol.  He says he does have a history of being prescribed blood pressure medication and a cholesterol pill, he is unsure if he has been taking his Lipitor or his lisinopril.  He has not been taking his amlodipine, he says he only takes 1 pill/day.  He denies any chest pain or shortness of breath but says he does have chronic visual problems and can see far away very well.  He has not been seeing an eye doctor.  He said he was surprised when he found out that his blood pressures in our office were 210s over [place]  PMH / PSH: Known hypertension  OBJECTIVE:   BP (!) 218/100   Pulse 75   Wt 198 lb 3.2 oz (89.9 kg)   SpO2 96%   BMI 27.64 kg/m   General: Very relaxed and well-appearing, no distress Eyes: Patient does have some distal visual deficits that he says are chronic, denies acute changes, to my funduscopic exam unable to identify any hemorrhages or edema Cardiac: Regular rate and rhythm, there is a 2/6 systolic murmur the patient says he was unaware of but he has no chest pain Respiratory: Symmetric air movement throughout, does have coarse lung sounds with no increased work of breathing or cough/wheeze  ECG reviewed and no ST  Elevations although there are some twave inversions likely demand given patients completely calm presentation  ASSESSMENT/PLAN:   Tobacco use disorder Patient is not smoking more since the Covid pandemic, politely refuses discussion of starting medication to stop smoking.  Says he felt irritated when he was taking the nicotine patch prior and thinks this was caused by the nicotine patch.  Told that this was far more likely to be withdrawal from stopping cigarettes and is something that he should expect, patient will think about it  Chart reviewed further  after patient's left the building, his smoking is increased as he is now to pack and half a day and based off prior doctors pack-year history this means he should qualify for low-dose chest CT at this point  As he is coming back in Monday I will make this note for the physician he scheduled to see and I will place the order in the system  Hyperlipidemia Unsure if patient has been taking atorvastatin or taking Lipitor but he is only been taking 1  Advised to restart both and will recheck lipids at this visit for baseline  Hyperglycemia History of hyperglycemia, has not been checked for diabetes in some time  A1c ordered and not resulted at time of this note  Systolic murmur Could be demand related given patient's elevated BP to systolic [id number], could also be structural abnormality so we will order echo for patient.  Does have no change in exercise tolerance  Hypertension goal BP (blood pressure) < 130/80 Well over goal with 210/100 confirmed twice manual.  Patient is completely asymptomatic with no headache, no acute visual changes, no chest pain, no shortness of breath, no urinary changes.  EKG without signs of STEMI  We discussed the serious nature of this with the patient and ED precautions thoroughly.  We did full labs to attempt to verify if there was any endorgan damage and will follow up with them as they become  available.  Patient is scheduled to see [place] again on Monday as he is restarting lisinopril(questionable if he was taking it or not) as well as amlodipine.       Sherene Sires, [place]

## 2019-05-28 NOTE — Patient Instructions (Signed)
It was a pleasure to see you today! Thank you for choosing [facility] for your primary care. Eric Nielsen was seen for physical and now significant hypertension. Come back to the clinic for appointment on Monday, if you get any shortness of breath or chest pain or stroke symptoms, please call 911 immediately.  We are going to have you restart your 2 blood pressure medications, amlodipine and lisinopril.  We will plan to pull some labs today and then do some follow-up on Monday.  Please do not forget that you need to schedule the echo and the visit with your eye doctor.  Were also going to have you speak to our referral coordinator, Kennyth Lose, front desk to talk about insurance assistance programs because she did not have insurance at this time.     Please bring all your medications to every doctors visit   Sign up for My Chart to have easy access to your labs results, and communication with your Primary care physician.     Please check-out at the front desk before leaving the clinic.     Best,  Dr. Sherene Sires FAMILY MEDICINE RESIDENT - PGY3 05/28/2019 2:19 PM

## 2019-05-29 LAB — COMPREHENSIVE METABOLIC PANEL
ALT: 22 IU/L (ref 0–44)
AST: 30 IU/L (ref 0–40)
Albumin/Globulin Ratio: 1.2 (ref 1.2–2.2)
Albumin: 4.3 g/dL (ref 3.8–4.9)
Alkaline Phosphatase: 100 IU/L (ref 39–117)
BUN/Creatinine Ratio: 8 — ABNORMAL LOW (ref 9–20)
BUN: 9 mg/dL (ref 6–24)
Bilirubin Total: 0.4 mg/dL (ref 0.0–1.2)
CO2: 22 mmol/L (ref 20–29)
Calcium: 9.2 mg/dL (ref 8.7–10.2)
Chloride: 104 mmol/L (ref 96–106)
Creatinine, Ser: 1.08 mg/dL (ref 0.76–1.27)
GFR calc Af Amer: 88 mL/min/{1.73_m2} (ref >59)
GFR calc non Af Amer: 76 mL/min/{1.73_m2} (ref >59)
Globulin, Total: 3.6 g/dL (ref 1.5–4.5)
Glucose: 141 mg/dL — ABNORMAL HIGH (ref 65–99)
Potassium: 3.7 mmol/L (ref 3.5–5.2)
Sodium: 140 mmol/L (ref 134–144)
Total Protein: 7.9 g/dL (ref 6.0–8.5)

## 2019-05-29 LAB — CBC
Hematocrit: 50 % (ref 37.5–51.0)
Hemoglobin: 18.1 g/dL — ABNORMAL HIGH (ref 13.0–17.7)
MCH: 34.3 pg — ABNORMAL HIGH (ref 26.6–33.0)
MCHC: 36.2 g/dL — ABNORMAL HIGH (ref 31.5–35.7)
MCV: 95 fL (ref 79–97)
Platelets: 118 10*3/uL — ABNORMAL LOW (ref 150–450)
RBC: 5.28 x10E6/uL (ref 4.14–5.80)
RDW: 12.6 % (ref 11.6–15.4)
WBC: 3.9 10*3/uL (ref 3.4–10.8)

## 2019-05-29 LAB — LIPID PANEL
Chol/HDL Ratio: 2.8 ratio (ref 0.0–5.0)
Cholesterol, Total: 142 mg/dL (ref 100–199)
HDL: 50 mg/dL (ref >39)
LDL Chol Calc (NIH): 79 mg/dL (ref 0–99)
Triglycerides: 64 mg/dL (ref 0–149)
VLDL Cholesterol Cal: 13 mg/dL (ref 5–40)

## 2019-05-29 LAB — HEMOGLOBIN A1C
Est. average glucose Bld gHb Est-mCnc: 111 mg/dL
Hgb A1c MFr Bld: 5.5 % (ref 4.8–5.6)

## 2019-05-29 LAB — TSH: TSH: 1.71 u[IU]/mL (ref 0.450–4.500)

## 2019-05-29 NOTE — Assessment & Plan Note (Signed)
Well over goal with 210/100 confirmed twice manual.  Patient is completely asymptomatic with no headache, no acute visual changes, no chest pain, no shortness of breath, no urinary changes.  EKG without signs of STEMI  We discussed the serious nature of this with the patient and ED precautions thoroughly.  We did full labs to attempt to verify if there was any endorgan damage and will follow up with them as they become available.  Patient is scheduled to see [place] again on Monday as he is restarting lisinopril(questionable if he was taking it or not) as well as amlodipine.

## 2019-05-29 NOTE — Assessment & Plan Note (Signed)
Could be demand related given patient's elevated BP to systolic [id number], could also be structural abnormality so we will order echo for patient.  Does have no change in exercise tolerance

## 2019-05-29 NOTE — Assessment & Plan Note (Addendum)
Patient is not smoking more since the Covid pandemic, politely refuses discussion of starting medication to stop smoking.  Says he felt irritated when he was taking the nicotine patch prior and thinks this was caused by the nicotine patch.  Told that this was far more likely to be withdrawal from stopping cigarettes and is something that he should expect, patient will think about it  Chart reviewed further after patient's left the building, his smoking is increased as he is now to pack and half a day and based off prior doctors pack-year history this means he should qualify for low-dose chest CT at this point  As he is coming back in Monday I will make this note for the physician he scheduled to see and I will place the order in the system

## 2019-05-29 NOTE — Assessment & Plan Note (Signed)
Unsure if patient has been taking atorvastatin or taking Lipitor but he is only been taking 1  Advised to restart both and will recheck lipids at this visit for baseline

## 2019-05-29 NOTE — Assessment & Plan Note (Signed)
History of hyperglycemia, has not been checked for diabetes in some time  A1c ordered and not resulted at time of this note

## 2019-06-02 ENCOUNTER — Ambulatory Visit (INDEPENDENT_AMBULATORY_CARE_PROVIDER_SITE_OTHER): Payer: Self-pay | Admitting: Family Medicine

## 2019-06-02 ENCOUNTER — Other Ambulatory Visit: Payer: Self-pay

## 2019-06-02 ENCOUNTER — Ambulatory Visit (HOSPITAL_COMMUNITY)
Admission: RE | Admit: 2019-06-02 | Discharge: 2019-06-02 | Disposition: A | Discharge status: Home / Self Care | Payer: Self-pay | Source: Ambulatory Visit | Attending: Family Medicine | Admitting: Family Medicine

## 2019-06-02 VITALS — BP 174/82 | HR 77 | Wt 197.0 lb

## 2019-06-02 DIAGNOSIS — I10 Essential (primary) hypertension: Secondary | ICD-10-CM

## 2019-06-02 DIAGNOSIS — R011 Cardiac murmur, unspecified: Secondary | ICD-10-CM

## 2019-06-02 DIAGNOSIS — I119 Hypertensive heart disease without heart failure: Secondary | ICD-10-CM | POA: Insufficient documentation

## 2019-06-02 DIAGNOSIS — I351 Nonrheumatic aortic (valve) insufficiency: Secondary | ICD-10-CM | POA: Insufficient documentation

## 2019-06-02 DIAGNOSIS — F17200 Nicotine dependence, unspecified, uncomplicated: Secondary | ICD-10-CM

## 2019-06-02 DIAGNOSIS — E785 Hyperlipidemia, unspecified: Secondary | ICD-10-CM | POA: Insufficient documentation

## 2019-06-02 MED ORDER — BLOOD PRESSURE KIT DEVI: 1.0000 | Freq: Two times a day (BID) | 0 refills | Status: AC

## 2019-06-02 MED ORDER — AMLODIPINE BESYLATE 10 MG PO TABS: 10.0000 mg | ORAL_TABLET | Freq: Every day | ORAL | 3 refills | Status: DC

## 2019-06-02 MED ORDER — ATORVASTATIN CALCIUM 40 MG PO TABS: 40.0000 mg | ORAL_TABLET | Freq: Every day | ORAL | 3 refills | Status: DC

## 2019-06-02 NOTE — Progress Notes (Signed)
    SUBJECTIVE:   CHIEF COMPLAINT / HPI:   Patient seen 3/10 with elevated BP 210/100s. Previously prescribed Lisinopril but patient has been noncompliant. Patient did not have insurance and affording medications is difficult.  F/u HTN: Today patient's BP is improved at 174/82 mmHg. He reports he has been taking is Lisinopril 40mg . Was prescribed Amlodipine 10mg  at last appt but had not yet picked it up from the pharmacy because when he went to get it it was very expensive. He denies any headaches, vision changes, chest pain or abdominal pain today. Labs from last appt were also reviewed with patient, did not reveal any end organ damage. He has not been checking his BP at home, his wife has a BP cuff.  Smoking History: The patient has about a 20 year smoking history. He states it took him about one year to go from 1-2 cigarettes daily to up to 1 and 1/2 packs daily. He has smoked 1 and 1/2 packs daily for 16 years. For the last 3 years he has smoked a little less than 1 pack daily. Per updated USPSTF guidelines patient would benefit from a Low Dose Screening Chest CT. However, patient does not yet have insurance (just turned in information for [employer] today) and this would be very expensive for him right now.   PERTINENT  PMH / PSH:  HTN Hx Polycythemia HLD Tobacco Use Disorder  OBJECTIVE:   BP (!) 174/82   Pulse 77   Wt 197 lb (89.4 kg)   SpO2 98%   BMI 27.48 kg/m    PHYSICAL EXAM: General: pleasant patient, well-appearing, no apparent distress Cardiac: RRR, S1 clear, Grade 2 systolic murmur best auscultated at R 2nd intercostal space Lungs: CTA bilaterally, comfortable work of breathing Extremities: no deformity, cyanosis or edema  ASSESSMENT/PLAN:   Hypertension goal BP (blood pressure) < 130/80 BP improved from 218/100 to 174/82 on Lisinopril. Has not yet gotten Amlodipine, was re-ordered in cheaper 30-day supply instead of 90-day. Remains asymptomatic. 1. Check BP at  home daily, record readings. BP cuff ordered, patient can also use machine at local pharmacy. 2. Take Lisinopril 40mg  and Amlodipine 20mg  every day, ideally at night 3. Follow up in 2 weeks (around 06/16/2019).  Tobacco use disorder -low dose CT Chest ordered for future encounter  -Patient does not have insurance but completed and turned in information for [employer] today. Will follow up once he obtains orange card    Daisy Floro, [place]

## 2019-06-02 NOTE — Patient Instructions (Addendum)
Thank you for coming in to see Eric Nielsen today! Please see below to review our plan for today's visit:  1. I have ordered a Blood Pressure monitor for you to use at home. Please check your blood pressure and record the reading at least once daily. 2. I have placed an order for the CT scan of your chest for smoking screening. Do not get this done until after you have the [employer]. 3. Take Lisinopril 40mg  and Amlodipine 20mg  every day to lower your blood pressure. It is ideal to take this at night. 4. Please come back to check in with Eric Nielsen (can be virtually) in 2 weeks (Around 06/16/2019).  Please call the clinic at [phone] if your symptoms worsen or you have any concerns. It was our pleasure to serve you!   Dr. Milus Banister [facility]

## 2019-06-02 NOTE — Assessment & Plan Note (Signed)
-  low dose CT Chest ordered for future encounter  -Patient does not have insurance but completed and turned in information for [employer] today. Will follow up once he obtains orange card

## 2019-06-02 NOTE — Assessment & Plan Note (Signed)
BP improved from 218/100 to 174/82 on Lisinopril. Has not yet gotten Amlodipine, was re-ordered in cheaper 30-day supply instead of 90-day. Remains asymptomatic. 1. Check BP at home daily, record readings. BP cuff ordered, patient can also use machine at local pharmacy. 2. Take Lisinopril 40mg  and Amlodipine 20mg  every day, ideally at night 3. Follow up in 2 weeks (around 06/16/2019).

## 2019-06-02 NOTE — Progress Notes (Signed)
  Echocardiogram 2D Echocardiogram has been performed.  Eric Nielsen M 06/02/2019, 9:51 AM

## 2019-06-04 ENCOUNTER — Encounter: Payer: Self-pay | Admitting: Family Medicine

## 2019-06-04 DIAGNOSIS — D696 Thrombocytopenia, unspecified: Secondary | ICD-10-CM | POA: Insufficient documentation

## 2019-07-08 ENCOUNTER — Ambulatory Visit
Admission: RE | Admit: 2019-07-08 | Discharge: 2019-07-08 | Disposition: A | Discharge status: Home / Self Care | Payer: BC Managed Care – PPO | Source: Ambulatory Visit | Attending: Family Medicine | Admitting: Family Medicine

## 2019-07-08 DIAGNOSIS — F17200 Nicotine dependence, unspecified, uncomplicated: Secondary | ICD-10-CM

## 2019-07-23 ENCOUNTER — Ambulatory Visit
Admission: RE | Admit: 2019-07-23 | Discharge: 2019-07-23 | Disposition: A | Discharge status: Home / Self Care | Payer: BC Managed Care – PPO | Source: Ambulatory Visit | Attending: Family Medicine | Admitting: Family Medicine

## 2019-07-23 ENCOUNTER — Other Ambulatory Visit: Payer: Self-pay

## 2019-08-07 ENCOUNTER — Other Ambulatory Visit: Payer: Self-pay

## 2019-08-07 DIAGNOSIS — I10 Essential (primary) hypertension: Secondary | ICD-10-CM

## 2019-08-07 MED ORDER — LISINOPRIL 40 MG PO TABS: ORAL_TABLET | ORAL | 12 refills | Status: DC

## 2019-09-16 ENCOUNTER — Ambulatory Visit: Payer: BC Managed Care – PPO

## 2019-09-24 ENCOUNTER — Encounter: Payer: Self-pay | Admitting: Family Medicine

## 2019-09-24 ENCOUNTER — Other Ambulatory Visit: Payer: Self-pay

## 2019-09-24 ENCOUNTER — Ambulatory Visit (INDEPENDENT_AMBULATORY_CARE_PROVIDER_SITE_OTHER): Payer: BC Managed Care – PPO | Admitting: Family Medicine

## 2019-09-24 VITALS — BP 154/72 | HR 93 | Ht 71.0 in | Wt 192.0 lb

## 2019-09-24 DIAGNOSIS — M25551 Pain in right hip: Secondary | ICD-10-CM | POA: Diagnosis not present

## 2019-09-24 DIAGNOSIS — M25552 Pain in left hip: Secondary | ICD-10-CM | POA: Diagnosis not present

## 2019-09-24 MED ORDER — DICLOFENAC SODIUM 1 % EX GEL: 2.0000 g | Freq: Four times a day (QID) | CUTANEOUS | 1 refills | Status: DC

## 2019-09-24 NOTE — Progress Notes (Signed)
    SUBJECTIVE:   CHIEF COMPLAINT / HPI:   Hip pain: bilateral. This is a chronic issue for years. Has gotten worse over the last 1 week. No inciting factor, trauma, injury, no previous surgery. He has history of osteoarthritis. Was referred to orthopedics in the past in 2016, x-ray significant for OA. He was given steroid injection which didn't help much, he is not interested in steroid injection today. He describes the pain as throbbing, sharp. Pain is worse with walking and getting up from sitting position.  Reports he had significant pain and discomfort this morning when getting out of bed.  Reports that the hip pain radiates to the bilateral, Denies any radiation down the back of his legs. Denies numbness or tingling in his legs, loss of bladder/bowel function. Denies fever, fatigue, night sweats, unintentional weight loss. He also reports history of back pain and neck pain.   He has not found any particularly helpful regimen for improving the pain. In the past he has tried icy hot roll on which was sometimes helpful. Has occasionally tried Aleve, used to take Ibuprofen for his back but not regularly, never took Tylenol.   PERTINENT  PMH / PSH:  Patient Active Problem List   Diagnosis Date Noted  . Thrombocytopenia ([place]) 06/04/2019  . Systolic murmur 62/95/2841  . Hyperglycemia 05/28/2019  . Hyperlipidemia 05/28/2019  . Annual physical exam 06/13/2017  . History of polycythemia 06/13/2017  . Alcohol use 06/13/2017  . Elevated liver enzymes 10/11/2016  . Breast mass, right 01/18/2016  . Chronic bilateral low back pain without sciatica 11/02/2015  . External hemorrhoid 04/09/2015  . Proctalgia 01/23/2015  . Tobacco use disorder 01/15/2015  . Lateral epicondylitis of right elbow 07/23/2014  . Trochanteric bursitis of right hip 06/01/2014  . Bilateral hip pain 04/20/2014  . Special screening for malignant neoplasms, colon 12/24/2013  . Hypertension goal BP (blood pressure) < 130/80  12/24/2013  . Syncope 04/18/2013    Class: Acute  . Abnormal EKG 04/18/2013     OBJECTIVE:   BP (!) 154/72   Pulse 93   Ht 5\' 11"  (1.803 m)   Wt 192 lb (87.1 kg)   SpO2 95%   BMI 26.78 kg/m    Physical exam: General: Pleasant patient, well-appearing, nontoxic Respiratory: CTA bilaterally, comfortable work of breathing Cardio: RRR, S1-S2 present, no murmurs appreciated Abdomen: Soft, nontender, normal bowel sounds appreciated Hips:  - Inspection: No gross deformity, no swelling, erythema, or ecchymosis to bilateral hips - Palpation: TTP, appreciated to musculature of bilateral hips, no greater trochanteric pains - ROM: ROM examination limited by pain, especially internal rotation - Strength: Normal strength. - Neuro/vasc: NV intact distally - Special Tests: Negative FABER, positive FADIR bilaterally.  Positive left-sided Trendelenburg, negative on the left.     ASSESSMENT/PLAN:   Bilateral hip pain Arthritic in nature, no red flags on physical exam.  1. Take Aleve/Naprosyn/Naproxen 500mg  twice daily. Tylenol 500mg  3 times daily. 2. Voltaren gel to be applied to the hips up to 4 times daily.  3. Orthopedics referral 4. Hip X-rays.     [place]

## 2019-09-24 NOTE — Assessment & Plan Note (Signed)
Arthritic in nature, no red flags on physical exam.  1. Take Aleve/Naprosyn/Naproxen 500mg  twice daily. Tylenol 500mg  3 times daily. 2. Voltaren gel to be applied to the hips up to 4 times daily.  3. Orthopedics referral 4. Hip X-rays.

## 2019-09-24 NOTE — Patient Instructions (Addendum)
Thank you for coming in to see [place] today! Please see below to review our plan for today's visit:  1. Take Aleve/Naprosyn/Naproxen 500mg  twice daily. Tylenol 500mg  3 times daily. 2. Voltaren gel to be applied to the hip up to 4 times daily.  3. I'm referring you to orthopedics.  4. Please obtain your hip X-rays.  Please call the clinic at [phone] if your symptoms worsen or you have any concerns. It was our pleasure to serve you!   Dr. Milus Banister [facility]

## 2019-12-30 DIAGNOSIS — Z125 Encounter for screening for malignant neoplasm of prostate: Secondary | ICD-10-CM | POA: Diagnosis not present

## 2019-12-30 DIAGNOSIS — Z23 Encounter for immunization: Secondary | ICD-10-CM | POA: Diagnosis not present

## 2019-12-30 DIAGNOSIS — I10 Essential (primary) hypertension: Secondary | ICD-10-CM | POA: Diagnosis not present

## 2019-12-30 DIAGNOSIS — M130 Polyarthritis, unspecified: Secondary | ICD-10-CM | POA: Diagnosis not present

## 2019-12-30 DIAGNOSIS — E782 Mixed hyperlipidemia: Secondary | ICD-10-CM | POA: Diagnosis not present

## 2020-01-20 DIAGNOSIS — E782 Mixed hyperlipidemia: Secondary | ICD-10-CM | POA: Diagnosis not present

## 2020-01-20 DIAGNOSIS — M130 Polyarthritis, unspecified: Secondary | ICD-10-CM | POA: Diagnosis not present

## 2020-01-20 DIAGNOSIS — I10 Essential (primary) hypertension: Secondary | ICD-10-CM | POA: Diagnosis not present

## 2020-02-19 DIAGNOSIS — M130 Polyarthritis, unspecified: Secondary | ICD-10-CM | POA: Diagnosis not present

## 2020-02-19 DIAGNOSIS — E782 Mixed hyperlipidemia: Secondary | ICD-10-CM | POA: Diagnosis not present

## 2020-02-19 DIAGNOSIS — I10 Essential (primary) hypertension: Secondary | ICD-10-CM | POA: Diagnosis not present

## 2020-04-12 DIAGNOSIS — I10 Essential (primary) hypertension: Secondary | ICD-10-CM | POA: Diagnosis not present

## 2020-04-12 DIAGNOSIS — M130 Polyarthritis, unspecified: Secondary | ICD-10-CM | POA: Diagnosis not present

## 2020-05-13 DIAGNOSIS — E782 Mixed hyperlipidemia: Secondary | ICD-10-CM | POA: Diagnosis not present

## 2020-05-13 DIAGNOSIS — I10 Essential (primary) hypertension: Secondary | ICD-10-CM | POA: Diagnosis not present

## 2020-05-13 DIAGNOSIS — M130 Polyarthritis, unspecified: Secondary | ICD-10-CM | POA: Diagnosis not present

## 2020-05-31 DIAGNOSIS — M130 Polyarthritis, unspecified: Secondary | ICD-10-CM | POA: Diagnosis not present

## 2020-05-31 DIAGNOSIS — E782 Mixed hyperlipidemia: Secondary | ICD-10-CM | POA: Diagnosis not present

## 2020-05-31 DIAGNOSIS — G894 Chronic pain syndrome: Secondary | ICD-10-CM | POA: Diagnosis not present

## 2020-05-31 DIAGNOSIS — I10 Essential (primary) hypertension: Secondary | ICD-10-CM | POA: Diagnosis not present

## 2020-07-01 DIAGNOSIS — M130 Polyarthritis, unspecified: Secondary | ICD-10-CM | POA: Diagnosis not present

## 2020-07-01 DIAGNOSIS — I10 Essential (primary) hypertension: Secondary | ICD-10-CM | POA: Diagnosis not present

## 2020-08-12 DIAGNOSIS — G894 Chronic pain syndrome: Secondary | ICD-10-CM | POA: Diagnosis not present

## 2020-08-12 DIAGNOSIS — N5201 Erectile dysfunction due to arterial insufficiency: Secondary | ICD-10-CM | POA: Diagnosis not present

## 2020-08-12 DIAGNOSIS — T464X5A Adverse effect of angiotensin-converting-enzyme inhibitors, initial encounter: Secondary | ICD-10-CM | POA: Diagnosis not present

## 2020-08-12 DIAGNOSIS — I10 Essential (primary) hypertension: Secondary | ICD-10-CM | POA: Diagnosis not present

## 2020-12-13 IMAGING — CT CT CHEST LUNG CANCER SCREENING LOW DOSE W/O CM
1 series · 15 of 31 positions shown, 19 images · non-contrast
Comparison: 04/18/2013 chest radiograph. No prior CT.

CLINICAL DATA: Thirty-five pack-year smoking history. Current
smoker.

EXAM:
CT CHEST WITHOUT CONTRAST LOW-DOSE FOR LUNG CANCER SCREENING
TECHNIQUE: Multidetector CT imaging of the chest was performed following the
standard protocol without IV contrast.

[Series 3: ldct screening <30 bmi · axial · 0.73mm/px · z∈[-316,-40]mm · 15 of 61 slices shown, 19 images]
[im 3/61  mediastinal]
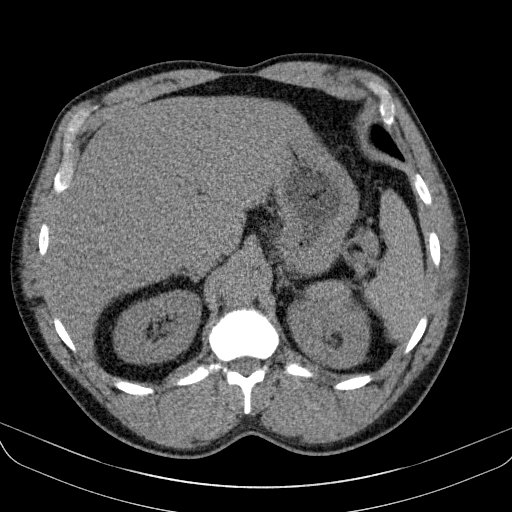
[im 3/61  lung]
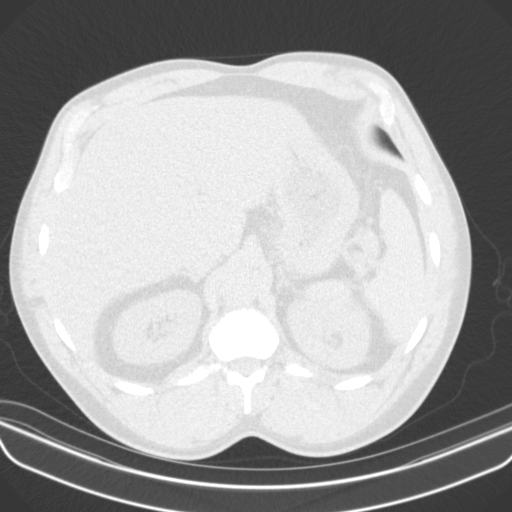
[im 7/61  lung]
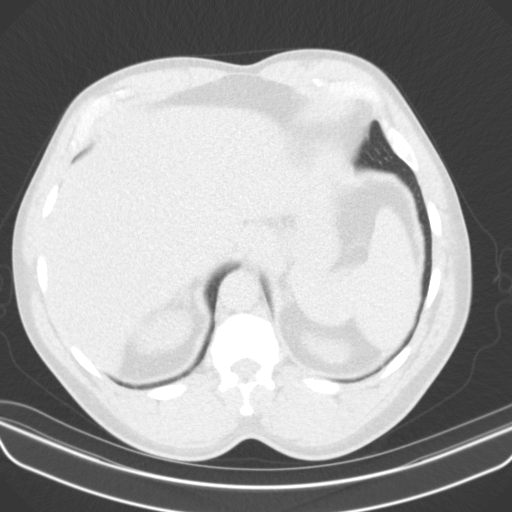
[im 12/61  lung]
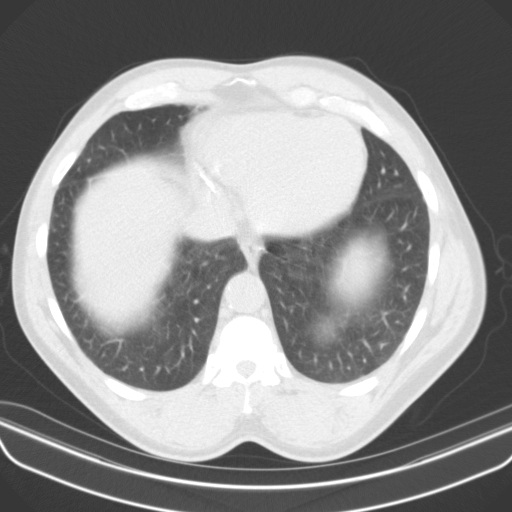
[im 14/61  lung]
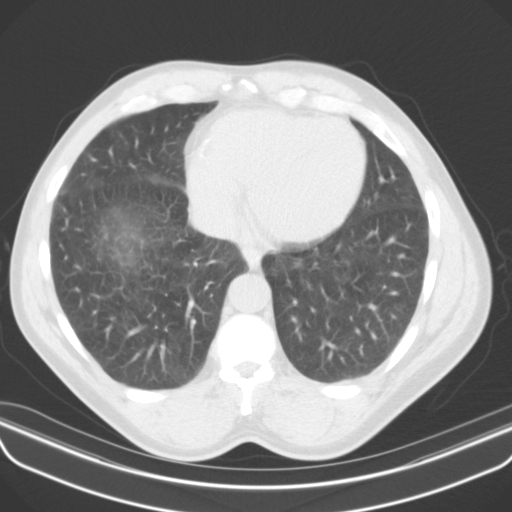
[im 18/61  mediastinal]
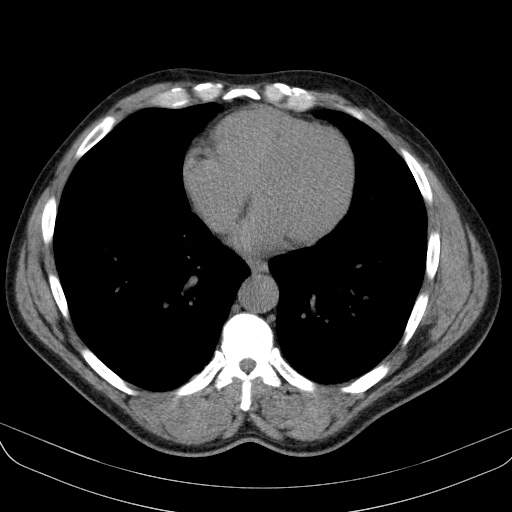
[im 18/61  lung]
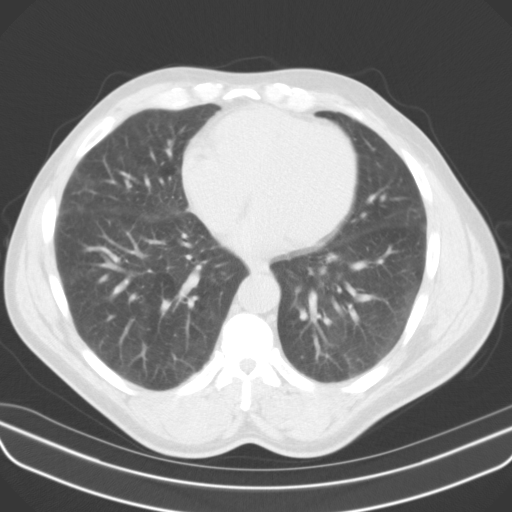
[im 23/61  lung]
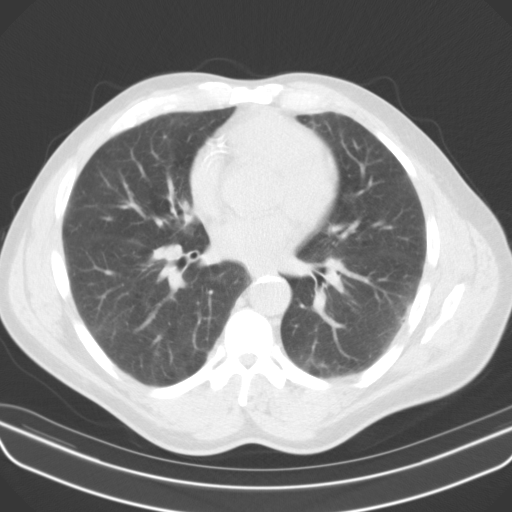
[im 27/61  lung]
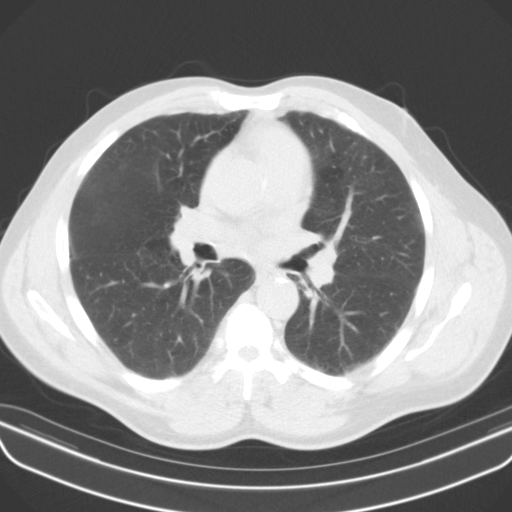
[im 32/61  lung]
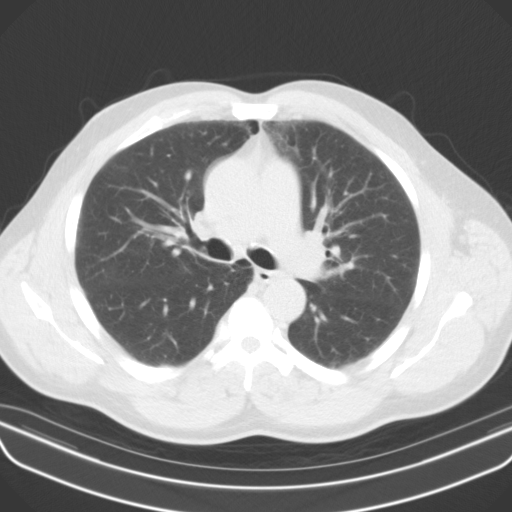
[im 34/61  mediastinal]
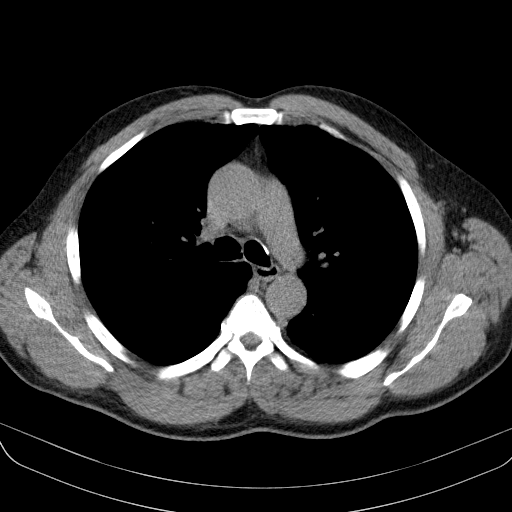
[im 34/61  lung]
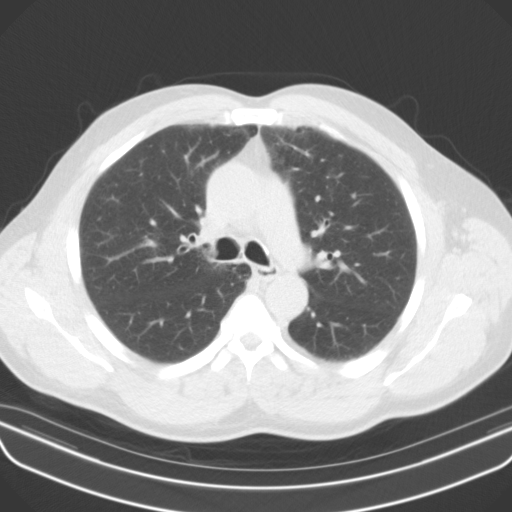
[im 37/61  lung]
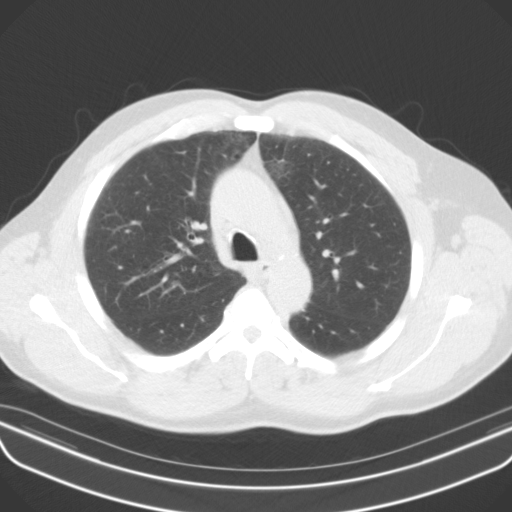
[im 41/61  lung]
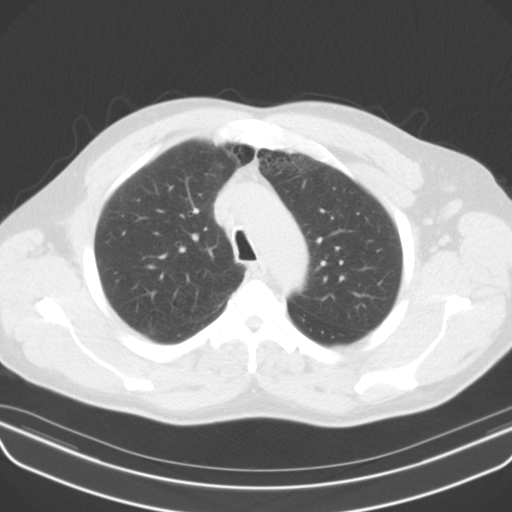
[im 45/61  lung]
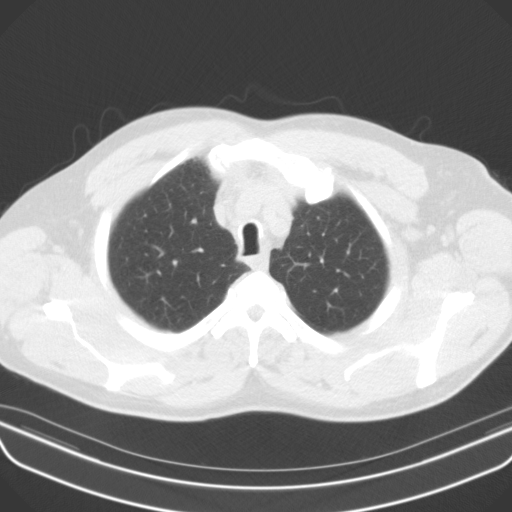
[im 49/61  mediastinal]
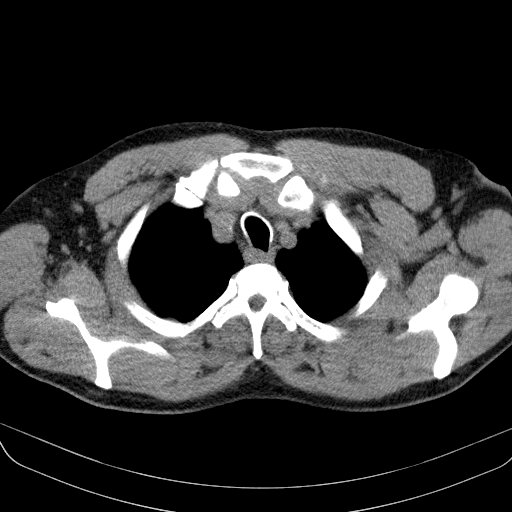
[im 49/61  lung]
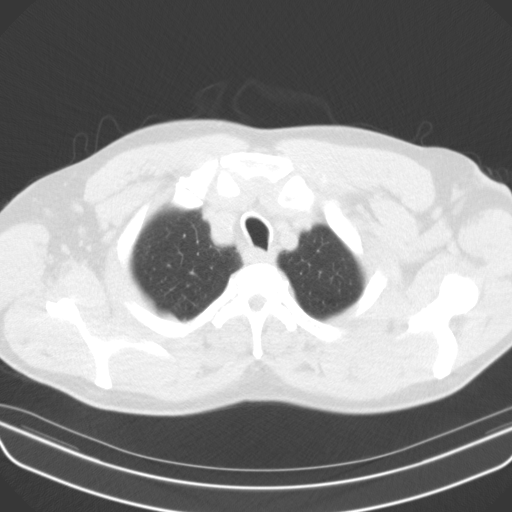
[im 54/61  lung]
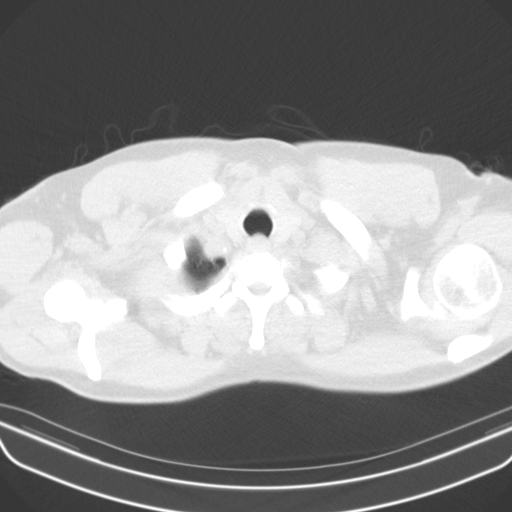
[im 58/61  lung]
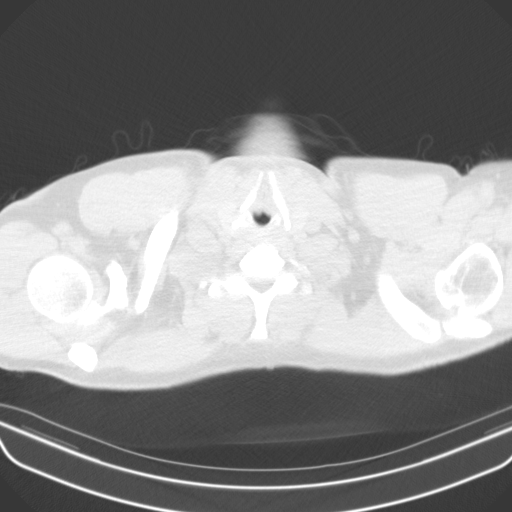

[15 of 31 positions shown; findings below may reference images not displayed]

FINDINGS: Cardiovascular: Aortic atherosclerosis. Normal heart size, without
pericardial effusion. Multivessel coronary artery atherosclerosis.

Mediastinum/Nodes: No mediastinal or definite hilar adenopathy,
given limitations of unenhanced CT.

Lungs/Pleura: No pleural fluid. mild centrilobular and moderate
paraseptal emphysema. No suspicious pulmonary nodule or mass.
Basilar predominant mild mosaic attenuation may relate to air
trapping.

Upper Abdomen: Normal imaged portions of the liver, spleen, stomach,
adrenal glands, kidneys.

Musculoskeletal: No acute osseous abnormality.
IMPRESSION: Lung-RADS 1, negative. Continue annual screening with low-dose chest
CT without contrast in 12 months.

Aortic atherosclerosis (UUD1D-8TH.H), coronary artery
atherosclerosis and emphysema (UUD1D-POZ.O).

## 2021-08-02 ENCOUNTER — Encounter: Payer: Self-pay | Admitting: Gastroenterology

## 2021-08-12 ENCOUNTER — Encounter: Payer: Self-pay | Admitting: Gastroenterology

## 2021-08-23 ENCOUNTER — Encounter: Payer: Self-pay | Admitting: *Deleted

## 2021-10-12 ENCOUNTER — Encounter: Payer: BC Managed Care – PPO | Admitting: Gastroenterology

## 2021-12-08 ENCOUNTER — Other Ambulatory Visit (INDEPENDENT_AMBULATORY_CARE_PROVIDER_SITE_OTHER): Payer: PRIVATE HEALTH INSURANCE

## 2021-12-08 ENCOUNTER — Ambulatory Visit (INDEPENDENT_AMBULATORY_CARE_PROVIDER_SITE_OTHER): Payer: PRIVATE HEALTH INSURANCE | Admitting: Emergency Medicine

## 2021-12-08 ENCOUNTER — Encounter: Payer: Self-pay | Admitting: Emergency Medicine

## 2021-12-08 VITALS — BP 200/98 | HR 61 | Temp 98.7°F | Ht 71.0 in | Wt 186.5 lb

## 2021-12-08 DIAGNOSIS — M545 Low back pain, unspecified: Secondary | ICD-10-CM | POA: Diagnosis not present

## 2021-12-08 DIAGNOSIS — E785 Hyperlipidemia, unspecified: Secondary | ICD-10-CM

## 2021-12-08 DIAGNOSIS — F17200 Nicotine dependence, unspecified, uncomplicated: Secondary | ICD-10-CM

## 2021-12-08 DIAGNOSIS — D696 Thrombocytopenia, unspecified: Secondary | ICD-10-CM

## 2021-12-08 DIAGNOSIS — G8929 Other chronic pain: Secondary | ICD-10-CM

## 2021-12-08 DIAGNOSIS — Z862 Personal history of diseases of the blood and blood-forming organs and certain disorders involving the immune mechanism: Secondary | ICD-10-CM | POA: Diagnosis not present

## 2021-12-08 DIAGNOSIS — Z23 Encounter for immunization: Secondary | ICD-10-CM

## 2021-12-08 DIAGNOSIS — J432 Centrilobular emphysema: Secondary | ICD-10-CM

## 2021-12-08 DIAGNOSIS — I10 Essential (primary) hypertension: Secondary | ICD-10-CM

## 2021-12-08 DIAGNOSIS — I2510 Atherosclerotic heart disease of native coronary artery without angina pectoris: Secondary | ICD-10-CM | POA: Insufficient documentation

## 2021-12-08 DIAGNOSIS — Z7689 Persons encountering health services in other specified circumstances: Secondary | ICD-10-CM

## 2021-12-08 DIAGNOSIS — I700 Atherosclerosis of aorta: Secondary | ICD-10-CM | POA: Insufficient documentation

## 2021-12-08 DIAGNOSIS — I119 Hypertensive heart disease without heart failure: Secondary | ICD-10-CM | POA: Diagnosis not present

## 2021-12-08 LAB — LIPID PANEL
Cholesterol: 129 mg/dL (ref 0–200)
HDL: 62.6 mg/dL (ref >39.00)
LDL Cholesterol: 56 mg/dL (ref 0–99)
NonHDL: 66.05
Total CHOL/HDL Ratio: 2
Triglycerides: 49 mg/dL (ref 0.0–149.0)
VLDL: 9.8 mg/dL (ref 0.0–40.0)

## 2021-12-08 LAB — URINALYSIS, ROUTINE W REFLEX MICROSCOPIC
Ketones, ur: NEGATIVE
Leukocytes,Ua: NEGATIVE
Nitrite: NEGATIVE
Specific Gravity, Urine: 1.025 (ref 1.000–1.030)
Urine Glucose: NEGATIVE
Urobilinogen, UA: 0.2 (ref 0.0–1.0)
WBC, UA: NONE SEEN (ref 0–?)
pH: 5.5 (ref 5.0–8.0)

## 2021-12-08 LAB — CBC WITH DIFFERENTIAL/PLATELET
Basophils Absolute: 0 10*3/uL (ref 0.0–0.1)
Basophils Relative: 0.9 % (ref 0.0–3.0)
Eosinophils Absolute: 0 10*3/uL (ref 0.0–0.7)
Eosinophils Relative: 1.5 % (ref 0.0–5.0)
HCT: 47 % (ref 39.0–52.0)
Hemoglobin: 16.4 g/dL (ref 13.0–17.0)
Lymphocytes Relative: 37.8 % (ref 12.0–46.0)
Lymphs Abs: 1.2 10*3/uL (ref 0.7–4.0)
MCHC: 34.8 g/dL (ref 30.0–36.0)
MCV: 97.4 fl (ref 78.0–100.0)
Monocytes Absolute: 0.8 10*3/uL (ref 0.1–1.0)
Monocytes Relative: 24.4 % — ABNORMAL HIGH (ref 3.0–12.0)
Neutro Abs: 1.1 10*3/uL — ABNORMAL LOW (ref 1.4–7.7)
Neutrophils Relative %: 35.4 % — ABNORMAL LOW (ref 43.0–77.0)
Platelets: 101 10*3/uL — ABNORMAL LOW (ref 150.0–400.0)
RBC: 4.83 Mil/uL (ref 4.22–5.81)
RDW: 12.9 % (ref 11.5–15.5)
WBC: 3.2 10*3/uL — ABNORMAL LOW (ref 4.0–10.5)

## 2021-12-08 LAB — COMPREHENSIVE METABOLIC PANEL
ALT: 79 U/L — ABNORMAL HIGH (ref 0–53)
AST: 79 U/L — ABNORMAL HIGH (ref 0–37)
Albumin: 4 g/dL (ref 3.5–5.2)
Alkaline Phosphatase: 76 U/L (ref 39–117)
BUN: 13 mg/dL (ref 6–23)
CO2: 28 mEq/L (ref 19–32)
Calcium: 9.3 mg/dL (ref 8.4–10.5)
Chloride: 104 mEq/L (ref 96–112)
Creatinine, Ser: 1.06 mg/dL (ref 0.40–1.50)
GFR: 77.04 mL/min (ref >60.00)
Glucose, Bld: 96 mg/dL (ref 70–99)
Potassium: 4.1 mEq/L (ref 3.5–5.1)
Sodium: 138 mEq/L (ref 135–145)
Total Bilirubin: 0.6 mg/dL (ref 0.2–1.2)
Total Protein: 7.7 g/dL (ref 6.0–8.3)

## 2021-12-08 MED ORDER — AMLODIPINE BESYLATE 10 MG PO TABS: 10.0000 mg | ORAL_TABLET | Freq: Every day | ORAL | 3 refills | Status: DC

## 2021-12-08 MED ORDER — ATORVASTATIN CALCIUM 40 MG PO TABS: 40.0000 mg | ORAL_TABLET | Freq: Every day | ORAL | 3 refills | Status: DC

## 2021-12-08 MED ORDER — VALSARTAN-HYDROCHLOROTHIAZIDE 160-12.5 MG PO TABS: 1.0000 | ORAL_TABLET | Freq: Every day | ORAL | 3 refills | Status: DC

## 2021-12-08 NOTE — Assessment & Plan Note (Signed)
Chronic pain affecting quality of life. Needs orthopedic evaluation. Referral placed today. May need pain management referral as well.

## 2021-12-08 NOTE — Assessment & Plan Note (Signed)
Most likely secondary to smoking. CBC done today.

## 2021-12-08 NOTE — Assessment & Plan Note (Signed)
Cardiovascular risks associated with uncontrolled hypertension discussed. Clinically stable and asymptomatic. Start amlodipine 10 mg and valsartan-HCTZ 12.5 mg daily.

## 2021-12-08 NOTE — Assessment & Plan Note (Signed)
Diet and nutrition discussed. Start atorvastatin 40 mg daily. Lipid profile done today

## 2021-12-08 NOTE — Patient Instructions (Signed)
Hypertension, Adult High blood pressure (hypertension) is when the force of blood pumping through the arteries is too strong. The arteries are the blood vessels that carry blood from the heart throughout the body. Hypertension forces the heart to work harder to pump blood and may cause arteries to become narrow or stiff. Untreated or uncontrolled hypertension can lead to a heart attack, heart failure, a stroke, kidney disease, and other problems. A blood pressure reading consists of a higher number over a lower number. Ideally, your blood pressure should be below 120/80. The first ("top") number is called the systolic pressure. It is a measure of the pressure in your arteries as your heart beats. The second ("bottom") number is called the diastolic pressure. It is a measure of the pressure in your arteries as the heart relaxes. What are the causes? The exact cause of this condition is not known. There are some conditions that result in high blood pressure. What increases the risk? Certain factors may make you more likely to develop high blood pressure. Some of these risk factors are under your control, including: Smoking. Not getting enough exercise or physical activity. Being overweight. Having too much fat, sugar, calories, or salt (sodium) in your diet. Drinking too much alcohol. Other risk factors include: Having a personal history of heart disease, diabetes, high cholesterol, or kidney disease. Stress. Having a family history of high blood pressure and high cholesterol. Having obstructive sleep apnea. Age. The risk increases with age. What are the signs or symptoms? High blood pressure may not cause symptoms. Very high blood pressure (hypertensive crisis) may cause: Headache. Fast or irregular heartbeats (palpitations). Shortness of breath. Nosebleed. Nausea and vomiting. Vision changes. Severe chest pain, dizziness, and seizures. How is this diagnosed? This condition is diagnosed by  measuring your blood pressure while you are seated, with your arm resting on a flat surface, your legs uncrossed, and your feet flat on the floor. The cuff of the blood pressure monitor will be placed directly against the skin of your upper arm at the level of your heart. Blood pressure should be measured at least twice using the same arm. Certain conditions can cause a difference in blood pressure between your right and left arms. If you have a high blood pressure reading during one visit or you have normal blood pressure with other risk factors, you may be asked to: Return on a different day to have your blood pressure checked again. Monitor your blood pressure at home for 1 week or longer. If you are diagnosed with hypertension, you may have other blood or imaging tests to help your health care provider understand your overall risk for other conditions. How is this treated? This condition is treated by making healthy lifestyle changes, such as eating healthy foods, exercising more, and reducing your alcohol intake. You may be referred for counseling on a healthy diet and physical activity. Your health care provider may prescribe medicine if lifestyle changes are not enough to get your blood pressure under control and if: Your systolic blood pressure is above 130. Your diastolic blood pressure is above 80. Your personal target blood pressure may vary depending on your medical conditions, your age, and other factors. Follow these instructions at home: Eating and drinking  Eat a diet that is high in fiber and potassium, and low in sodium, added sugar, and fat. An example of this eating plan is called the DASH diet. DASH stands for Dietary Approaches to Stop Hypertension. To eat this way: Eat   plenty of fresh fruits and vegetables. Try to fill one half of your plate at each meal with fruits and vegetables. Eat whole grains, such as whole-wheat pasta, brown rice, or whole-grain bread. Fill about one  fourth of your plate with whole grains. Eat or drink low-fat dairy products, such as skim milk or low-fat yogurt. Avoid fatty cuts of meat, processed or cured meats, and poultry with skin. Fill about one fourth of your plate with lean proteins, such as fish, chicken without skin, beans, eggs, or tofu. Avoid pre-made and processed foods. These tend to be higher in sodium, added sugar, and fat. Reduce your daily sodium intake. Many people with hypertension should eat less than 1,500 mg of sodium a day. Do not drink alcohol if: Your health care provider tells you not to drink. You are pregnant, may be pregnant, or are planning to become pregnant. If you drink alcohol: Limit how much you have to: 0-1 drink a day for women. 0-2 drinks a day for men. Know how much alcohol is in your drink. In the [place] one drink equals one 12 oz bottle of beer (355 mL), one 5 oz glass of wine (148 mL), or one 1 oz glass of hard liquor (44 mL). Lifestyle  Work with your health care provider to maintain a healthy body weight or to lose weight. Ask what an ideal weight is for you. Get at least 30 minutes of exercise that causes your heart to beat faster (aerobic exercise) most days of the week. Activities may include walking, swimming, or biking. Include exercise to strengthen your muscles (resistance exercise), such as Pilates or lifting weights, as part of your weekly exercise routine. Try to do these types of exercises for 30 minutes at least 3 days a week. Do not use any products that contain nicotine or tobacco. These products include cigarettes, chewing tobacco, and vaping devices, such as e-cigarettes. If you need help quitting, ask your health care provider. Monitor your blood pressure at home as told by your health care provider. Keep all follow-up visits. This is important. Medicines Take over-the-counter and prescription medicines only as told by your health care provider. Follow directions carefully. Blood  pressure medicines must be taken as prescribed. Do not skip doses of blood pressure medicine. Doing this puts you at risk for problems and can make the medicine less effective. Ask your health care provider about side effects or reactions to medicines that you should watch for. Contact a health care provider if you: Think you are having a reaction to a medicine you are taking. Have headaches that keep coming back (recurring). Feel dizzy. Have swelling in your ankles. Have trouble with your vision. Get help right away if you: Develop a severe headache or confusion. Have unusual weakness or numbness. Feel faint. Have severe pain in your chest or abdomen. Vomit repeatedly. Have trouble breathing. These symptoms may be an emergency. Get help right away. Call 911. Do not wait to see if the symptoms will go away. Do not drive yourself to the hospital. Summary Hypertension is when the force of blood pumping through your arteries is too strong. If this condition is not controlled, it may put you at risk for serious complications. Your personal target blood pressure may vary depending on your medical conditions, your age, and other factors. For most people, a normal blood pressure is less than 120/80. Hypertension is treated with lifestyle changes, medicines, or a combination of both. Lifestyle changes include losing weight, eating a healthy,   low-sodium diet, exercising more, and limiting alcohol. This information is not intended to replace advice given to you by your health care provider. Make sure you discuss any questions you have with your health care provider. Document Revised: 01/11/2021 Document Reviewed: 01/11/2021 Elsevier Patient Education  2023 [employer]  

## 2021-12-08 NOTE — Assessment & Plan Note (Signed)
Cardiovascular and cancer risks associated with smoking discussed. °Smoking cessation advice given. °

## 2021-12-08 NOTE — Assessment & Plan Note (Signed)
EKG shows LVH.  No acute ischemic changes. No clinical findings of congestive heart failure Needs cardiology evaluation and echocardiogram. Referral placed today.

## 2021-12-08 NOTE — Assessment & Plan Note (Signed)
Diet and nutrition discussed.  Start atorvastatin 40 mg daily. Lipid profile done today.

## 2021-12-08 NOTE — Assessment & Plan Note (Signed)
Incidental finding on CT of chest done 07/08/2019 No anginal episodes. Needs cardiology evaluation.  Referral placed today. Advised to take daily baby aspirin.

## 2021-12-08 NOTE — Progress Notes (Signed)
Eric Nielsen 61 y.o.   Chief Complaint  Patient presents with   New Patient (Initial Visit)    Tingling feeling up his left arm to his fingers , chronic bil hip pain     HISTORY OF PRESENT ILLNESS: This is a 61 y.o. male first visit to this office, here to establish care with me. Has the following chronic medical problems: 1.  Hypertension.  Presently on no medications 2.  Dyslipidemia.  Not taking atorvastatin 40 mg 3.  Chronic smoker.  CT chest done in 2021 shows emphysema 4.  Chronic back and hip pain 5.  Aortic atherosclerosis per CT scan 6.  Coronary artery atherosclerosis per CT scan  HPI   Prior to Admission medications   Medication Sig Start Date End Date Taking? Authorizing Provider  amLODipine (NORVASC) 10 MG tablet Take 1 tablet (10 mg total) by mouth daily. 06/02/19  Yes Milus Banister C, DO  atorvastatin (LIPITOR) 40 MG tablet Take 1 tablet (40 mg total) by mouth daily. 06/02/19  Yes Milus Banister C, DO  diclofenac Sodium (VOLTAREN) 1 % GEL Apply 2 g topically 4 (four) times daily. 09/24/19  Yes Milus Banister C, DO  olmesartan (BENICAR) 20 MG tablet Take 20 mg by mouth daily. 09/16/21  Yes [provider]  Blood Pressure Monitoring (BLOOD PRESSURE KIT) DEVI 1 kit by Does not apply route 2 (two) times daily. Patient not taking: Reported on 12/08/2021 06/02/19   Milus Banister C, DO    No Known Allergies  Patient Active Problem List   Diagnosis Date Noted   Thrombocytopenia ([place]) 39/76/7341   Systolic murmur 93/79/0240   Dyslipidemia 05/28/2019   History of polycythemia 06/13/2017   Alcohol use 06/13/2017   Chronic bilateral low back pain without sciatica 11/02/2015   External hemorrhoid 04/09/2015   Tobacco use disorder 01/15/2015   Essential hypertension 12/24/2013   Abnormal EKG 04/18/2013    Past Medical History:  Diagnosis Date   Abscess, perirectal    Arthritis    HTN (hypertension)    Hyperlipidemia    Hypertension     Past  Surgical History:  Procedure Laterality Date   INCISION AND DRAINAGE PERIRECTAL ABSCESS N/A 10/14/2012   Procedure: IRRIGATION AND DEBRIDEMENT PERIRECTAL ABSCESS;  Surgeon: Zenovia Jarred, MD;  Location: [facility];  Service: General;  Laterality: N/A;   INCISION AND DRAINAGE PERIRECTAL ABSCESS N/A 01/23/2015   Procedure: EXAM UNDER ANESTHESIA;  Surgeon: Erroll Luna, MD;  Location: [facility];  Service: General;  Laterality: N/A;    Social History   Socioeconomic History   Marital status: Married    Spouse name: Not on file   Number of children: Not on file   Years of education: Not on file   Highest education level: Not on file  Occupational History   Not on file  Tobacco Use   Smoking status: Heavy Smoker    Packs/day: 1.00    Types: Cigarettes   Smokeless tobacco: Never  Vaping Use   Vaping Use: Never used  Substance and Sexual Activity   Alcohol use: Yes    Alcohol/week: 4.0 standard drinks of alcohol    Types: 4 Cans of beer per week   Drug use: Never   Sexual activity: Yes    Partners: Female  Other Topics Concern   Not on file  Social History Narrative   ** Merged History Encounter **       Social Determinants of Health   Financial Resource Strain: Not on file  Food  Insecurity: Not on file  Transportation Needs: Not on file  Physical Activity: Not on file  Stress: Not on file  Social Connections: Not on file  Intimate Partner Violence: Not on file    Family History  Problem Relation Age of Onset   Hypertension Mother    Cancer Mother        Lung and breast and brain   Heart failure Father    Hypertension Father    Hypertension Brother    Diabetes Brother    Colon cancer Neg Hx      Review of Systems  Constitutional: Negative.  Negative for chills and fever.  HENT: Negative.  Negative for congestion and sore throat.   Respiratory: Negative.  Negative for cough and shortness of breath.   Cardiovascular: Negative.  Negative for chest pain and  palpitations.  Gastrointestinal: Negative.  Negative for abdominal pain, diarrhea, nausea and vomiting.  Genitourinary: Negative.   Musculoskeletal:  Positive for back pain and neck pain.  Skin: Negative.  Negative for rash.  Neurological:  Negative for dizziness and headaches.  All other systems reviewed and are negative.   Today's Vitals   12/08/21 1051 12/08/21 1058  BP: (!) 180/96 (!) 200/98  Pulse: 61   Temp: 98.7 F (37.1 C)   TempSrc: Oral   SpO2: 97%   Weight: 186 lb 8 oz (84.6 kg)   Height: _0  (1.803 m)    Body mass index is 26.01 kg/m.  Physical Exam Vitals reviewed.  Constitutional:      Appearance: Normal appearance.  HENT:     Head: Normocephalic.     Mouth/Throat:     Mouth: Mucous membranes are moist.     Pharynx: Oropharynx is clear.  Eyes:     Extraocular Movements: Extraocular movements intact.     Pupils: Pupils are equal, round, and reactive to light.  Cardiovascular:     Rate and Rhythm: Normal rate and regular rhythm.     Pulses: Normal pulses.     Heart sounds: Normal heart sounds.  Pulmonary:     Effort: Pulmonary effort is normal.     Breath sounds: Normal breath sounds.  Abdominal:     Palpations: Abdomen is soft.     Tenderness: There is no abdominal tenderness.  Musculoskeletal:     Cervical back: No tenderness.     Right lower leg: No edema.     Left lower leg: No edema.  Lymphadenopathy:     Cervical: No cervical adenopathy.  Skin:    General: Skin is warm and dry.     Capillary Refill: Capillary refill takes less than 2 seconds.  Neurological:     General: No focal deficit present.     Mental Status: He is alert and oriented to person, place, and time.     Cranial Nerves: No cranial nerve deficit.     Sensory: No sensory deficit.     Motor: No weakness.     Coordination: Coordination normal.     Gait: Gait normal.  Psychiatric:        Mood and Affect: Mood normal.        Behavior: Behavior normal.    EKG: Sinus  bradycardia with ventricular rate of 55/min.  LVH.  No acute ischemic changes.  ASSESSMENT & PLAN: A total of 63 minutes was spent with the patient and counseling/coordination of care regarding preparing for this visit, review of available medical records, establishing care with me, review of multiple chronic medical problems  and their management, review of all medications and changes made, diagnosis of uncontrolled hypertension and cardiovascular risks associated with this condition, need to start medications, need for cardiology evaluation, smoking cessation advice given, education on nutrition, need for blood work today, prognosis, documentation, and need for follow-up in 4 weeks.  Problem List Items Addressed This Visit       Cardiovascular and Mediastinum   Essential hypertension    History of hypertension but presently on no medications. Uncontrolled at present time. Dietary approaches to stop hypertension discussed. We will start amlodipine 10 mg and valsartan-HCTZ 160-12.5 mg daily. Follow-up in 4 weeks.      Relevant Medications   amLODipine (NORVASC) 10 MG tablet   valsartan-hydrochlorothiazide (DIOVAN-HCT) 160-12.5 MG tablet   atorvastatin (LIPITOR) 40 MG tablet   Other Relevant Orders   Ambulatory referral to Cardiology   Uncontrolled hypertension - Primary    Cardiovascular risks associated with uncontrolled hypertension discussed. Clinically stable and asymptomatic. Start amlodipine 10 mg and valsartan-HCTZ 12.5 mg daily.      Relevant Medications   amLODipine (NORVASC) 10 MG tablet   valsartan-hydrochlorothiazide (DIOVAN-HCT) 160-12.5 MG tablet   atorvastatin (LIPITOR) 40 MG tablet   Other Relevant Orders   EKG 12-Lead   CBC with Differential/Platelet   Comprehensive metabolic panel   Hemoglobin A1c   Urinalysis   Hypertensive left ventricular hypertrophy, without heart failure    EKG shows LVH.  No acute ischemic changes. No clinical findings of congestive  heart failure Needs cardiology evaluation and echocardiogram. Referral placed today.      Relevant Medications   amLODipine (NORVASC) 10 MG tablet   valsartan-hydrochlorothiazide (DIOVAN-HCT) 160-12.5 MG tablet   atorvastatin (LIPITOR) 40 MG tablet   Other Relevant Orders   EKG 12-Lead   Ambulatory referral to Cardiology   Atherosclerosis of native coronary artery of native heart without angina pectoris    Incidental finding on CT of chest done 07/08/2019 No anginal episodes. Needs cardiology evaluation.  Referral placed today. Advised to take daily baby aspirin.      Relevant Medications   amLODipine (NORVASC) 10 MG tablet   valsartan-hydrochlorothiazide (DIOVAN-HCT) 160-12.5 MG tablet   atorvastatin (LIPITOR) 40 MG tablet   Other Relevant Orders   Ambulatory referral to Cardiology   Atherosclerosis of aorta ([place])    Diet and nutrition discussed. Start atorvastatin 40 mg daily. Lipid profile done today      Relevant Medications   amLODipine (NORVASC) 10 MG tablet   valsartan-hydrochlorothiazide (DIOVAN-HCT) 160-12.5 MG tablet   atorvastatin (LIPITOR) 40 MG tablet     Respiratory   Centrilobular emphysema ([facility])    Chronic smoker.  Asymptomatic.        Hematopoietic and Hemostatic   Thrombocytopenia ([place])     Other   Tobacco use disorder    Cardiovascular and cancer risks associated with smoking discussed. Smoking cessation advice given.      Relevant Medications   atorvastatin (LIPITOR) 40 MG tablet   Chronic bilateral low back pain without sciatica    Chronic pain affecting quality of life. Needs orthopedic evaluation. Referral placed today. May need pain management referral as well.      Relevant Orders   Ambulatory referral to Orthopedic Surgery   History of polycythemia    Most likely secondary to smoking. CBC done today.      Dyslipidemia    Diet and nutrition discussed.  Start atorvastatin 40 mg daily. Lipid profile done today.       Relevant Medications  atorvastatin (LIPITOR) 40 MG tablet   Other Relevant Orders   Lipid panel   Other Visit Diagnoses     Encounter to establish care          Patient Instructions  Hypertension, Adult High blood pressure (hypertension) is when the force of blood pumping through the arteries is too strong. The arteries are the blood vessels that carry blood from the heart throughout the body. Hypertension forces the heart to work harder to pump blood and may cause arteries to become narrow or stiff. Untreated or uncontrolled hypertension can lead to a heart attack, heart failure, a stroke, kidney disease, and other problems. A blood pressure reading consists of a higher number over a lower number. Ideally, your blood pressure should be below 120/80. The first ("top") number is called the systolic pressure. It is a measure of the pressure in your arteries as your heart beats. The second ("bottom") number is called the diastolic pressure. It is a measure of the pressure in your arteries as the heart relaxes. What are the causes? The exact cause of this condition is not known. There are some conditions that result in high blood pressure. What increases the risk? Certain factors may make you more likely to develop high blood pressure. Some of these risk factors are under your control, including: Smoking. Not getting enough exercise or physical activity. Being overweight. Having too much fat, sugar, calories, or salt (sodium) in your diet. Drinking too much alcohol. Other risk factors include: Having a personal history of heart disease, diabetes, high cholesterol, or kidney disease. Stress. Having a family history of high blood pressure and high cholesterol. Having obstructive sleep apnea. Age. The risk increases with age. What are the signs or symptoms? High blood pressure may not cause symptoms. Very high blood pressure (hypertensive crisis) may cause: Headache. Fast or irregular  heartbeats (palpitations). Shortness of breath. Nosebleed. Nausea and vomiting. Vision changes. Severe chest pain, dizziness, and seizures. How is this diagnosed? This condition is diagnosed by measuring your blood pressure while you are seated, with your arm resting on a flat surface, your legs uncrossed, and your feet flat on the floor. The cuff of the blood pressure monitor will be placed directly against the skin of your upper arm at the level of your heart. Blood pressure should be measured at least twice using the same arm. Certain conditions can cause a difference in blood pressure between your right and left arms. If you have a high blood pressure reading during one visit or you have normal blood pressure with other risk factors, you may be asked to: Return on a different day to have your blood pressure checked again. Monitor your blood pressure at home for 1 week or longer. If you are diagnosed with hypertension, you may have other blood or imaging tests to help your health care provider understand your overall risk for other conditions. How is this treated? This condition is treated by making healthy lifestyle changes, such as eating healthy foods, exercising more, and reducing your alcohol intake. You may be referred for counseling on a healthy diet and physical activity. Your health care provider may prescribe medicine if lifestyle changes are not enough to get your blood pressure under control and if: Your systolic blood pressure is above 130. Your diastolic blood pressure is above 80. Your personal target blood pressure may vary depending on your medical conditions, your age, and other factors. Follow these instructions at home: Eating and drinking  Eat a diet  that is high in fiber and potassium, and low in sodium, added sugar, and fat. An example of this eating plan is called the DASH diet. DASH stands for Dietary Approaches to Stop Hypertension. To eat this way: Eat plenty of  fresh fruits and vegetables. Try to fill one half of your plate at each meal with fruits and vegetables. Eat whole grains, such as whole-wheat pasta, brown rice, or whole-grain bread. Fill about one fourth of your plate with whole grains. Eat or drink low-fat dairy products, such as skim milk or low-fat yogurt. Avoid fatty cuts of meat, processed or cured meats, and poultry with skin. Fill about one fourth of your plate with lean proteins, such as fish, chicken without skin, beans, eggs, or tofu. Avoid pre-made and processed foods. These tend to be higher in sodium, added sugar, and fat. Reduce your daily sodium intake. Many people with hypertension should eat less than 1,500 mg of sodium a day. Do not drink alcohol if: Your health care provider tells you not to drink. You are pregnant, may be pregnant, or are planning to become pregnant. If you drink alcohol: Limit how much you have to: 0-1 drink a day for women. 0-2 drinks a day for men. Know how much alcohol is in your drink. In the [place] one drink equals one 12 oz bottle of beer (355 mL), one 5 oz glass of wine (148 mL), or one 1 oz glass of hard liquor (44 mL). Lifestyle  Work with your health care provider to maintain a healthy body weight or to lose weight. Ask what an ideal weight is for you. Get at least 30 minutes of exercise that causes your heart to beat faster (aerobic exercise) most days of the week. Activities may include walking, swimming, or biking. Include exercise to strengthen your muscles (resistance exercise), such as Pilates or lifting weights, as part of your weekly exercise routine. Try to do these types of exercises for 30 minutes at least 3 days a week. Do not use any products that contain nicotine or tobacco. These products include cigarettes, chewing tobacco, and vaping devices, such as e-cigarettes. If you need help quitting, ask your health care provider. Monitor your blood pressure at home as told by your health  care provider. Keep all follow-up visits. This is important. Medicines Take over-the-counter and prescription medicines only as told by your health care provider. Follow directions carefully. Blood pressure medicines must be taken as prescribed. Do not skip doses of blood pressure medicine. Doing this puts you at risk for problems and can make the medicine less effective. Ask your health care provider about side effects or reactions to medicines that you should watch for. Contact a health care provider if you: Think you are having a reaction to a medicine you are taking. Have headaches that keep coming back (recurring). Feel dizzy. Have swelling in your ankles. Have trouble with your vision. Get help right away if you: Develop a severe headache or confusion. Have unusual weakness or numbness. Feel faint. Have severe pain in your chest or abdomen. Vomit repeatedly. Have trouble breathing. These symptoms may be an emergency. Get help right away. Call 911. Do not wait to see if the symptoms will go away. Do not drive yourself to the hospital. Summary Hypertension is when the force of blood pumping through your arteries is too strong. If this condition is not controlled, it may put you at risk for serious complications. Your personal target blood pressure may vary depending on  your medical conditions, your age, and other factors. For most people, a normal blood pressure is less than 120/80. Hypertension is treated with lifestyle changes, medicines, or a combination of both. Lifestyle changes include losing weight, eating a healthy, low-sodium diet, exercising more, and limiting alcohol. This information is not intended to replace advice given to you by your health care provider. Make sure you discuss any questions you have with your health care provider. Document Revised: 01/11/2021 Document Reviewed: 01/11/2021 Elsevier Patient Education  K[place],  MD [facility]

## 2021-12-08 NOTE — Assessment & Plan Note (Signed)
History of hypertension but presently on no medications. Uncontrolled at present time. Dietary approaches to stop hypertension discussed. We will start amlodipine 10 mg and valsartan-HCTZ 160-12.5 mg daily. Follow-up in 4 weeks.

## 2021-12-08 NOTE — Assessment & Plan Note (Signed)
Chronic smoker.  Asymptomatic.

## 2021-12-12 LAB — HEMOGLOBIN A1C: Hgb A1c MFr Bld: 5.5 % (ref 4.6–6.5)

## 2021-12-15 ENCOUNTER — Telehealth: Payer: Self-pay

## 2021-12-15 NOTE — Telephone Encounter (Signed)
Patients wife was advised to call once Arbor gets a muscle spasm and he is currently having one right now. Took all medications this morning, wants to know what to do.

## 2021-12-19 NOTE — Telephone Encounter (Signed)
Called patient wife to see how the patient was feeling. She states patient was feeling better didn't have any more muscle spasms. Encouraged to keep appt with Dr. Mitchel Honour for this month

## 2021-12-21 ENCOUNTER — Encounter: Payer: Self-pay | Admitting: Orthopedic Surgery

## 2021-12-21 ENCOUNTER — Ambulatory Visit (INDEPENDENT_AMBULATORY_CARE_PROVIDER_SITE_OTHER): Payer: 59

## 2021-12-21 ENCOUNTER — Ambulatory Visit (INDEPENDENT_AMBULATORY_CARE_PROVIDER_SITE_OTHER): Payer: PRIVATE HEALTH INSURANCE

## 2021-12-21 ENCOUNTER — Ambulatory Visit (INDEPENDENT_AMBULATORY_CARE_PROVIDER_SITE_OTHER): Payer: PRIVATE HEALTH INSURANCE | Admitting: Orthopedic Surgery

## 2021-12-21 VITALS — BP 166/72 | HR 60 | Ht 71.0 in | Wt 186.5 lb

## 2021-12-21 DIAGNOSIS — M545 Low back pain, unspecified: Secondary | ICD-10-CM

## 2021-12-21 DIAGNOSIS — M25552 Pain in left hip: Secondary | ICD-10-CM

## 2021-12-21 DIAGNOSIS — M25551 Pain in right hip: Secondary | ICD-10-CM | POA: Diagnosis not present

## 2021-12-21 DIAGNOSIS — G8929 Other chronic pain: Secondary | ICD-10-CM

## 2021-12-21 NOTE — Progress Notes (Signed)
Orthopedic Spine Surgery Office Note  Assessment: Patient is a 61 y.o. male with chronic low back and bilateral hip pain.  His hip pain is more significant and on physical exam all of his findings point towards his pain being from the hip.  Radiographs also show degenerative changes in both hip joints. Diagnosis: bilateral hip osteoarthritis.   Plan: -Explained that initially conservative treatment is tried as a significant number of patients may experience relief with these treatment modalities. Discussed that the conservative treatments include:  -activity modification  -physical therapy  -over the counter pain medications  -intraarticular injections -Patient has tried ibuprofen and Tylenol -Recommended he start with some physical therapy and do the home exercises.  He was also interested in intra-articular injection for relief of the pain.  This is a reasonable nonoperative treatment, so recommended he see Dr. Rolena Infante for consideration of intra-articular hip injection -He can continue with tylenol. Told him he can do '1000mg'$  TID as needed - Discussed the importance of smoking cessation for his overall health but also should he fail conservative treatment and develop pain that interferes with his daily activities, he may elect to proceed with a total hip arthroplasty.  Explained that often a requirement for surgery would be smoking cessation. -Patient should return to my office on an as-needed basis, x-rays needed at next visit: None   Patient expressed understanding of the plan and all questions were answered to their satisfaction.   ___________________________________________________________________________   History:  Patient is a 61 y.o. male who presents today for lumbar spine and bilateral hip pain.  Patient has had many years of low back and bilateral hip pain.  There is no trauma or injury that brought on the pain.  He states that his hip pain is much more significant.  His right hip  is slightly more symptomatic than his left hip.  He feels that with activity and prolonged standing.  He feels pain with rotation of the hip as well.  Pain is felt in the groin and on the lateral aspect of the hip.  The pain improves with rest and laying down.  He has tried ibuprofen and Tylenol which initially provided little relief but are no longer providing any relief.  He does not have any leg pain.   Weakness: Denies Symptoms of imbalance: Denies Paresthesias and numbness: Denies Bowel or bladder incontinence: Denies Saddle anesthesia: Denies  Treatments tried: Ibuprofen and Tylenol  Review of systems: Denies fevers and chills, night sweats, unexplained weight loss, history of cancer, pain that wakes them at night  Past medical history: HLD HTN  Allergies: NKDA  Past surgical history:  I&D perirectal abscess  Social history: Reports use of nicotine product (smoking, vaping, patches, smokeless), 1ppd Alcohol use: yes, ~4 drinks.week Denies recreational drug use   Physical Exam:  General: no acute distress, appears stated age Neurologic: alert, answering questions appropriately, following commands Respiratory: unlabored breathing on room air, symmetric chest rise Psychiatric: appropriate affect, normal cadence to speech   MSK (spine):  -Strength exam      Left  Right EHL    5/5  5/5 TA    5/5  5/5 GSC    5/5  5/5 Knee extension  5/5  5/5 Hip flexion   5/5  5/5  -Sensory exam    Sensation intact to light touch in L3-S1 nerve distributions of bilateral lower extremities  -Achilles DTR: 1/4 on the left, 1/4 on the right -Patellar tendon DTR: 1/4 on the left, 1/4 on  the right  -Straight leg raise: Negative -Contralateral straight leg raise: Negative -Femoral nerve stretch test: Negative -Clonus: no beats bilaterally -No pain with extension at the lumbar spine  -Left hip exam: Pain with internal and external rotation at the hip, can internally rotate to  about 15 degrees, external rotation to 50 degrees, positive Stinchfield, positive FADIR -Right hip exam: Pain with internal and external rotation of the hip, can internally rotate to about 10 degrees, external rotation 45 degrees, positive Stinchfield, positive FADIR  Imaging: X-ray of the lumbar spine from 12/21/2021 was independently reviewed and interpreted, showing generative disc disease at L4-5 and L5-S1.  There is grade 1 anterolisthesis of L4 on L5.  No acute osseous abnormality.  Facet arthropathy at lower lumbar levels as well.  X-ray of the bilateral hips from 12/21/2021 was independently reviewed and interpreted, showing bilateral hip osteoarthritis with osteophyte formation, subchondral cyst formation, joint space narrowing, and subchondral sclerosis.  No acute osseous abnormality.  Cam deformity noted in bilateral hips.   Patient name: Eric Nielsen Patient MRN: [id number] Date of visit: 12/21/21

## 2021-12-23 ENCOUNTER — Ambulatory Visit: Payer: Self-pay

## 2021-12-23 ENCOUNTER — Encounter: Payer: Self-pay | Admitting: Sports Medicine

## 2021-12-23 ENCOUNTER — Ambulatory Visit (INDEPENDENT_AMBULATORY_CARE_PROVIDER_SITE_OTHER): Payer: PRIVATE HEALTH INSURANCE | Admitting: Sports Medicine

## 2021-12-23 VITALS — BP 146/77 | HR 66

## 2021-12-23 DIAGNOSIS — M160 Bilateral primary osteoarthritis of hip: Secondary | ICD-10-CM

## 2021-12-23 DIAGNOSIS — M25552 Pain in left hip: Secondary | ICD-10-CM

## 2021-12-23 DIAGNOSIS — M25551 Pain in right hip: Secondary | ICD-10-CM | POA: Diagnosis not present

## 2021-12-23 MED ORDER — METHYLPREDNISOLONE ACETATE 40 MG/ML IJ SUSP
80.0000 mg | INTRAMUSCULAR | Status: AC | PRN
  Administered 2021-12-23: 80 mg via INTRA_ARTICULAR

## 2021-12-23 MED ORDER — LIDOCAINE HCL 1 % IJ SOLN
4.0000 mL | INTRAMUSCULAR | Status: AC | PRN
  Administered 2021-12-23: 4 mL

## 2021-12-23 NOTE — Progress Notes (Signed)
   Procedure Note  Patient: Eric Nielsen             Date of Birth: Jul 19, 1960           MRN: [id number]             Visit Date: 12/23/2021  Procedures: Visit Diagnoses:  1. Pain in left hip   2. Pain in right hip    Large Joint Inj: R hip joint on 12/23/2021 3:46 PM Indications: pain Details: 22 G 3.5 in needle, ultrasound-guided anterior approach Medications: 4 mL lidocaine 1 %; 80 mg methylPREDNISolone acetate 40 MG/ML Outcome: tolerated well, no immediate complications  Procedure: US-guided intra-articular hip injection, right After discussion on risks/benefits/indications and informed verbal consent was obtained, a timeout was performed. Patient was lying supine on exam table. The hip was cleaned with betadine and alcohol swabs. Then utilizing ultrasound guidance, the patient's femoral head and neck junction was identified and subsequently injected with 4:2 lidocaine:depomedrol via an in-plane approach with ultrasound visualization of the injectate administered into the hip joint. Patient tolerated procedure well without immediate complications.  Procedure, treatment alternatives, risks and benefits explained, specific risks discussed. Consent was given by the patient. Immediately prior to procedure a time out was called to verify the correct patient, procedure, equipment, support staff and site/side marked as required. Patient was prepped and draped in the usual sterile fashion.    - I evaluated the patient about 10 minutes post-injection and they had improvement in pain and range of motion.  He states he feels "no pain."  -We had a discussion on performing both hip injections today, or proceeding with injection into the more bothersome hip.  He wanted to do the right hip first, we will see what kind of relief he gets over the next 7-10 days, if he wishes to move forward with a left hip injection he can get this done after 10-14 days days from this current injection - continue  follow-up with Dr. Laurance Flatten as indicated  Elba Barman, DO [place]  This note was dictated using Dragon naturally speaking software and may contain errors in syntax, spelling, or content which have not been identified prior to signing this note.

## 2022-01-02 ENCOUNTER — Ambulatory Visit: Payer: PRIVATE HEALTH INSURANCE | Admitting: Orthopedic Surgery

## 2022-01-05 ENCOUNTER — Ambulatory Visit: Payer: PRIVATE HEALTH INSURANCE | Admitting: Interventional Cardiology

## 2022-01-05 ENCOUNTER — Ambulatory Visit (INDEPENDENT_AMBULATORY_CARE_PROVIDER_SITE_OTHER): Payer: PRIVATE HEALTH INSURANCE | Admitting: Emergency Medicine

## 2022-01-05 ENCOUNTER — Encounter: Payer: Self-pay | Admitting: Emergency Medicine

## 2022-01-05 VITALS — BP 148/76 | HR 63 | Temp 98.2°F | Ht 71.0 in | Wt 184.5 lb

## 2022-01-05 DIAGNOSIS — F17200 Nicotine dependence, unspecified, uncomplicated: Secondary | ICD-10-CM

## 2022-01-05 DIAGNOSIS — I10 Essential (primary) hypertension: Secondary | ICD-10-CM | POA: Diagnosis not present

## 2022-01-05 DIAGNOSIS — Z1211 Encounter for screening for malignant neoplasm of colon: Secondary | ICD-10-CM

## 2022-01-05 DIAGNOSIS — E785 Hyperlipidemia, unspecified: Secondary | ICD-10-CM | POA: Diagnosis not present

## 2022-01-05 NOTE — Assessment & Plan Note (Addendum)
Better controlled hypertension. Continue amlodipine 10 mg and valsartan-HCTZ 160-12.5 mg daily Cardiovascular risks associated with hypertension discussed. Dietary approaches to stop hypertension discussed. Needs cardiology evaluation.  Scheduled for next month. Follow-up in 3 months.

## 2022-01-05 NOTE — Assessment & Plan Note (Signed)
Cardiovascular and cancer risk associated with smoking discussed. Smoking cessation advice given. 

## 2022-01-05 NOTE — Progress Notes (Signed)
Eric Nielsen 61 y.o.   Chief Complaint  Patient presents with   Follow-up    4 week f/u , patient has a knot on his chest area     HISTORY OF PRESENT ILLNESS: This is a 61 y.o. male here for 4-week follow-up of uncontrolled hypertension. Presently on amlodipine 10 mg and valsartan HCTZ 160-12.5 mg daily.  Doing well. Was referred to cardiology but no appointment scheduled yet. Still smoking every now and then although less. Still drinking 2-3 beers per day. Eating better and staying physically active. Concerned about bump on left chest area.  HPI   Prior to Admission medications   Medication Sig Start Date End Date Taking? Authorizing Provider  amLODipine (NORVASC) 10 MG tablet Take 1 tablet (10 mg total) by mouth daily. 12/08/21  Yes Sagardia, Ines Bloomer, MD  atorvastatin (LIPITOR) 40 MG tablet Take 1 tablet (40 mg total) by mouth daily. 12/08/21  Yes Sagardia, Ines Bloomer, MD  diclofenac Sodium (VOLTAREN) 1 % GEL Apply 2 g topically 4 (four) times daily. 09/24/19  Yes Milus Banister C, DO  valsartan-hydrochlorothiazide (DIOVAN-HCT) 160-12.5 MG tablet Take 1 tablet by mouth daily. 12/08/21  Yes Sagardia, Ines Bloomer, MD  Blood Pressure Monitoring (BLOOD PRESSURE KIT) DEVI 1 kit by Does not apply route 2 (two) times daily. Patient not taking: Reported on 12/08/2021 06/02/19   Milus Banister C, DO    No Known Allergies  Patient Active Problem List   Diagnosis Date Noted   Uncontrolled hypertension 12/08/2021   Hypertensive left ventricular hypertrophy, without heart failure 12/08/2021   Atherosclerosis of native coronary artery of native heart without angina pectoris 12/08/2021   Centrilobular emphysema ([place]) 12/08/2021   Atherosclerosis of aorta ([place]) 12/08/2021   Thrombocytopenia ([place]) 50/27/7412   Systolic murmur 87/86/7672   Dyslipidemia 05/28/2019   History of polycythemia 06/13/2017   Alcohol use 06/13/2017   Chronic bilateral low back pain without sciatica  11/02/2015   External hemorrhoid 04/09/2015   Tobacco use disorder 01/15/2015   Essential hypertension 12/24/2013   Abnormal EKG 04/18/2013    Past Medical History:  Diagnosis Date   Abscess, perirectal    Arthritis    HTN (hypertension)    Hyperlipidemia    Hypertension     Past Surgical History:  Procedure Laterality Date   INCISION AND DRAINAGE PERIRECTAL ABSCESS N/A 10/14/2012   Procedure: IRRIGATION AND DEBRIDEMENT PERIRECTAL ABSCESS;  Surgeon: Zenovia Jarred, MD;  Location: [facility];  Service: General;  Laterality: N/A;   INCISION AND DRAINAGE PERIRECTAL ABSCESS N/A 01/23/2015   Procedure: EXAM UNDER ANESTHESIA;  Surgeon: Erroll Luna, MD;  Location: [facility] OR;  Service: General;  Laterality: N/A;    Social History   Socioeconomic History   Marital status: Married    Spouse name: Not on file   Number of children: Not on file   Years of education: Not on file   Highest education level: Not on file  Occupational History   Not on file  Tobacco Use   Smoking status: Heavy Smoker    Packs/day: 1.00    Types: Cigarettes   Smokeless tobacco: Never  Vaping Use   Vaping Use: Never used  Substance and Sexual Activity   Alcohol use: Yes    Alcohol/week: 4.0 standard drinks of alcohol    Types: 4 Cans of beer per week   Drug use: Never   Sexual activity: Yes    Partners: Female  Other Topics Concern   Not on file  Social History Narrative   **  Merged History Encounter **       Social Determinants of Health   Financial Resource Strain: Not on file  Food Insecurity: Not on file  Transportation Needs: Not on file  Physical Activity: Not on file  Stress: Not on file  Social Connections: Not on file  Intimate Partner Violence: Not on file    Family History  Problem Relation Age of Onset   Hypertension Mother    Cancer Mother        Lung and breast and brain   Heart failure Father    Hypertension Father    Hypertension Brother    Diabetes Brother    Colon  cancer Neg Hx      Review of Systems  Constitutional: Negative.  Negative for chills and fever.  HENT: Negative.  Negative for congestion and sore throat.   Eyes: Negative.   Respiratory: Negative.  Negative for cough and shortness of breath.   Cardiovascular: Negative.  Negative for chest pain and palpitations.  Gastrointestinal:  Negative for abdominal pain, diarrhea, nausea and vomiting.  Genitourinary: Negative.   Musculoskeletal: Negative.   Skin: Negative.  Negative for rash.  Neurological: Negative.  Negative for dizziness and headaches.  All other systems reviewed and are negative.  Today's Vitals   01/05/22 1033 01/05/22 1036  BP: (!) 150/76 (!) 148/76  Pulse: 63   Temp: 98.2 F (36.8 C)   TempSrc: Oral   SpO2: 97%   Weight: 184 lb 8 oz (83.7 kg)   Height: 5' 11" (1.803 m)    Body mass index is 25.73 kg/m.  Physical Exam Vitals reviewed.  Constitutional:      Appearance: Normal appearance.  HENT:     Head: Normocephalic.     Mouth/Throat:     Mouth: Mucous membranes are moist.     Pharynx: Oropharynx is clear.  Eyes:     Extraocular Movements: Extraocular movements intact.     Pupils: Pupils are equal, round, and reactive to light.  Cardiovascular:     Rate and Rhythm: Normal rate and regular rhythm.     Pulses: Normal pulses.     Heart sounds: Normal heart sounds.  Pulmonary:     Effort: Pulmonary effort is normal.     Breath sounds: Normal breath sounds.  Chest:     Comments: Left-sided rib cage bony overgrowth Musculoskeletal:        General: Normal range of motion.     Cervical back: No tenderness.  Lymphadenopathy:     Cervical: No cervical adenopathy.  Skin:    General: Skin is warm and dry.  Neurological:     General: No focal deficit present.     Mental Status: He is alert and oriented to person, place, and time.  Psychiatric:        Mood and Affect: Mood normal.        Behavior: Behavior normal.    ASSESSMENT & PLAN: A total of 48  minutes was spent with the patient and counseling/coordination of care regarding preparing for this visit, review of most recent office visit notes, review of most recent blood work results, review of multiple chronic medical problems and their management, review of all medications, diagnosis of hypertension and cardiovascular risks associated with this condition, education on nutrition, prognosis, smoking cessation advice, documentation, and need for follow-up after cardiology evaluation next month.  Problem List Items Addressed This Visit       Cardiovascular and Mediastinum   Essential hypertension  Better controlled hypertension. Continue amlodipine 10 mg and valsartan-HCTZ 160-12.5 mg daily Cardiovascular risks associated with hypertension discussed. Dietary approaches to stop hypertension discussed. Needs cardiology evaluation.  Scheduled for next month. Follow-up in 3 months.        Other   Tobacco use disorder    Cardiovascular and cancer risk associated with smoking discussed. Smoking cessation advice given.      Dyslipidemia    Stable.  Diet and nutrition discussed. Continue atorvastatin 40 mg daily.       Other Visit Diagnoses     Colon cancer screening    -  Primary   Relevant Orders   Ambulatory referral to Gastroenterology      Patient Instructions  Hypertension, Adult High blood pressure (hypertension) is when the force of blood pumping through the arteries is too strong. The arteries are the blood vessels that carry blood from the heart throughout the body. Hypertension forces the heart to work harder to pump blood and may cause arteries to become narrow or stiff. Untreated or uncontrolled hypertension can lead to a heart attack, heart failure, a stroke, kidney disease, and other problems. A blood pressure reading consists of a higher number over a lower number. Ideally, your blood pressure should be below 120/80. The first ("top") number is called the  systolic pressure. It is a measure of the pressure in your arteries as your heart beats. The second ("bottom") number is called the diastolic pressure. It is a measure of the pressure in your arteries as the heart relaxes. What are the causes? The exact cause of this condition is not known. There are some conditions that result in high blood pressure. What increases the risk? Certain factors may make you more likely to develop high blood pressure. Some of these risk factors are under your control, including: Smoking. Not getting enough exercise or physical activity. Being overweight. Having too much fat, sugar, calories, or salt (sodium) in your diet. Drinking too much alcohol. Other risk factors include: Having a personal history of heart disease, diabetes, high cholesterol, or kidney disease. Stress. Having a family history of high blood pressure and high cholesterol. Having obstructive sleep apnea. Age. The risk increases with age. What are the signs or symptoms? High blood pressure may not cause symptoms. Very high blood pressure (hypertensive crisis) may cause: Headache. Fast or irregular heartbeats (palpitations). Shortness of breath. Nosebleed. Nausea and vomiting. Vision changes. Severe chest pain, dizziness, and seizures. How is this diagnosed? This condition is diagnosed by measuring your blood pressure while you are seated, with your arm resting on a flat surface, your legs uncrossed, and your feet flat on the floor. The cuff of the blood pressure monitor will be placed directly against the skin of your upper arm at the level of your heart. Blood pressure should be measured at least twice using the same arm. Certain conditions can cause a difference in blood pressure between your right and left arms. If you have a high blood pressure reading during one visit or you have normal blood pressure with other risk factors, you may be asked to: Return on a different day to have your  blood pressure checked again. Monitor your blood pressure at home for 1 week or longer. If you are diagnosed with hypertension, you may have other blood or imaging tests to help your health care provider understand your overall risk for other conditions. How is this treated? This condition is treated by making healthy lifestyle changes, such as eating healthy  foods, exercising more, and reducing your alcohol intake. You may be referred for counseling on a healthy diet and physical activity. Your health care provider may prescribe medicine if lifestyle changes are not enough to get your blood pressure under control and if: Your systolic blood pressure is above 130. Your diastolic blood pressure is above 80. Your personal target blood pressure may vary depending on your medical conditions, your age, and other factors. Follow these instructions at home: Eating and drinking  Eat a diet that is high in fiber and potassium, and low in sodium, added sugar, and fat. An example of this eating plan is called the DASH diet. DASH stands for Dietary Approaches to Stop Hypertension. To eat this way: Eat plenty of fresh fruits and vegetables. Try to fill one half of your plate at each meal with fruits and vegetables. Eat whole grains, such as whole-wheat pasta, brown rice, or whole-grain bread. Fill about one fourth of your plate with whole grains. Eat or drink low-fat dairy products, such as skim milk or low-fat yogurt. Avoid fatty cuts of meat, processed or cured meats, and poultry with skin. Fill about one fourth of your plate with lean proteins, such as fish, chicken without skin, beans, eggs, or tofu. Avoid pre-made and processed foods. These tend to be higher in sodium, added sugar, and fat. Reduce your daily sodium intake. Many people with hypertension should eat less than 1,500 mg of sodium a day. Do not drink alcohol if: Your health care provider tells you not to drink. You are pregnant, may be  pregnant, or are planning to become pregnant. If you drink alcohol: Limit how much you have to: 0-1 drink a day for women. 0-2 drinks a day for men. Know how much alcohol is in your drink. In the [place] one drink equals one 12 oz bottle of beer (355 mL), one 5 oz glass of wine (148 mL), or one 1 oz glass of hard liquor (44 mL). Lifestyle  Work with your health care provider to maintain a healthy body weight or to lose weight. Ask what an ideal weight is for you. Get at least 30 minutes of exercise that causes your heart to beat faster (aerobic exercise) most days of the week. Activities may include walking, swimming, or biking. Include exercise to strengthen your muscles (resistance exercise), such as Pilates or lifting weights, as part of your weekly exercise routine. Try to do these types of exercises for 30 minutes at least 3 days a week. Do not use any products that contain nicotine or tobacco. These products include cigarettes, chewing tobacco, and vaping devices, such as e-cigarettes. If you need help quitting, ask your health care provider. Monitor your blood pressure at home as told by your health care provider. Keep all follow-up visits. This is important. Medicines Take over-the-counter and prescription medicines only as told by your health care provider. Follow directions carefully. Blood pressure medicines must be taken as prescribed. Do not skip doses of blood pressure medicine. Doing this puts you at risk for problems and can make the medicine less effective. Ask your health care provider about side effects or reactions to medicines that you should watch for. Contact a health care provider if you: Think you are having a reaction to a medicine you are taking. Have headaches that keep coming back (recurring). Feel dizzy. Have swelling in your ankles. Have trouble with your vision. Get help right away if you: Develop a severe headache or confusion. Have unusual weakness or  numbness. Feel faint. Have severe pain in your chest or abdomen. Vomit repeatedly. Have trouble breathing. These symptoms may be an emergency. Get help right away. Call 911. Do not wait to see if the symptoms will go away. Do not drive yourself to the hospital. Summary Hypertension is when the force of blood pumping through your arteries is too strong. If this condition is not controlled, it may put you at risk for serious complications. Your personal target blood pressure may vary depending on your medical conditions, your age, and other factors. For most people, a normal blood pressure is less than 120/80. Hypertension is treated with lifestyle changes, medicines, or a combination of both. Lifestyle changes include losing weight, eating a healthy, low-sodium diet, exercising more, and limiting alcohol. This information is not intended to replace advice given to you by your health care provider. Make sure you discuss any questions you have with your health care provider. Document Revised: 01/11/2021 Document Reviewed: 01/11/2021 Elsevier Patient Education  [place], MD [facility]

## 2022-01-05 NOTE — Patient Instructions (Signed)
Hypertension, Adult High blood pressure (hypertension) is when the force of blood pumping through the arteries is too strong. The arteries are the blood vessels that carry blood from the heart throughout the body. Hypertension forces the heart to work harder to pump blood and may cause arteries to become narrow or stiff. Untreated or uncontrolled hypertension can lead to a heart attack, heart failure, a stroke, kidney disease, and other problems. A blood pressure reading consists of a higher number over a lower number. Ideally, your blood pressure should be below 120/80. The first ("top") number is called the systolic pressure. It is a measure of the pressure in your arteries as your heart beats. The second ("bottom") number is called the diastolic pressure. It is a measure of the pressure in your arteries as the heart relaxes. What are the causes? The exact cause of this condition is not known. There are some conditions that result in high blood pressure. What increases the risk? Certain factors may make you more likely to develop high blood pressure. Some of these risk factors are under your control, including: Smoking. Not getting enough exercise or physical activity. Being overweight. Having too much fat, sugar, calories, or salt (sodium) in your diet. Drinking too much alcohol. Other risk factors include: Having a personal history of heart disease, diabetes, high cholesterol, or kidney disease. Stress. Having a family history of high blood pressure and high cholesterol. Having obstructive sleep apnea. Age. The risk increases with age. What are the signs or symptoms? High blood pressure may not cause symptoms. Very high blood pressure (hypertensive crisis) may cause: Headache. Fast or irregular heartbeats (palpitations). Shortness of breath. Nosebleed. Nausea and vomiting. Vision changes. Severe chest pain, dizziness, and seizures. How is this diagnosed? This condition is diagnosed by  measuring your blood pressure while you are seated, with your arm resting on a flat surface, your legs uncrossed, and your feet flat on the floor. The cuff of the blood pressure monitor will be placed directly against the skin of your upper arm at the level of your heart. Blood pressure should be measured at least twice using the same arm. Certain conditions can cause a difference in blood pressure between your right and left arms. If you have a high blood pressure reading during one visit or you have normal blood pressure with other risk factors, you may be asked to: Return on a different day to have your blood pressure checked again. Monitor your blood pressure at home for 1 week or longer. If you are diagnosed with hypertension, you may have other blood or imaging tests to help your health care provider understand your overall risk for other conditions. How is this treated? This condition is treated by making healthy lifestyle changes, such as eating healthy foods, exercising more, and reducing your alcohol intake. You may be referred for counseling on a healthy diet and physical activity. Your health care provider may prescribe medicine if lifestyle changes are not enough to get your blood pressure under control and if: Your systolic blood pressure is above 130. Your diastolic blood pressure is above 80. Your personal target blood pressure may vary depending on your medical conditions, your age, and other factors. Follow these instructions at home: Eating and drinking  Eat a diet that is high in fiber and potassium, and low in sodium, added sugar, and fat. An example of this eating plan is called the DASH diet. DASH stands for Dietary Approaches to Stop Hypertension. To eat this way: Eat   plenty of fresh fruits and vegetables. Try to fill one half of your plate at each meal with fruits and vegetables. Eat whole grains, such as whole-wheat pasta, brown rice, or whole-grain bread. Fill about one  fourth of your plate with whole grains. Eat or drink low-fat dairy products, such as skim milk or low-fat yogurt. Avoid fatty cuts of meat, processed or cured meats, and poultry with skin. Fill about one fourth of your plate with lean proteins, such as fish, chicken without skin, beans, eggs, or tofu. Avoid pre-made and processed foods. These tend to be higher in sodium, added sugar, and fat. Reduce your daily sodium intake. Many people with hypertension should eat less than 1,500 mg of sodium a day. Do not drink alcohol if: Your health care provider tells you not to drink. You are pregnant, may be pregnant, or are planning to become pregnant. If you drink alcohol: Limit how much you have to: 0-1 drink a day for women. 0-2 drinks a day for men. Know how much alcohol is in your drink. In the [place] one drink equals one 12 oz bottle of beer (355 mL), one 5 oz glass of wine (148 mL), or one 1 oz glass of hard liquor (44 mL). Lifestyle  Work with your health care provider to maintain a healthy body weight or to lose weight. Ask what an ideal weight is for you. Get at least 30 minutes of exercise that causes your heart to beat faster (aerobic exercise) most days of the week. Activities may include walking, swimming, or biking. Include exercise to strengthen your muscles (resistance exercise), such as Pilates or lifting weights, as part of your weekly exercise routine. Try to do these types of exercises for 30 minutes at least 3 days a week. Do not use any products that contain nicotine or tobacco. These products include cigarettes, chewing tobacco, and vaping devices, such as e-cigarettes. If you need help quitting, ask your health care provider. Monitor your blood pressure at home as told by your health care provider. Keep all follow-up visits. This is important. Medicines Take over-the-counter and prescription medicines only as told by your health care provider. Follow directions carefully. Blood  pressure medicines must be taken as prescribed. Do not skip doses of blood pressure medicine. Doing this puts you at risk for problems and can make the medicine less effective. Ask your health care provider about side effects or reactions to medicines that you should watch for. Contact a health care provider if you: Think you are having a reaction to a medicine you are taking. Have headaches that keep coming back (recurring). Feel dizzy. Have swelling in your ankles. Have trouble with your vision. Get help right away if you: Develop a severe headache or confusion. Have unusual weakness or numbness. Feel faint. Have severe pain in your chest or abdomen. Vomit repeatedly. Have trouble breathing. These symptoms may be an emergency. Get help right away. Call 911. Do not wait to see if the symptoms will go away. Do not drive yourself to the hospital. Summary Hypertension is when the force of blood pumping through your arteries is too strong. If this condition is not controlled, it may put you at risk for serious complications. Your personal target blood pressure may vary depending on your medical conditions, your age, and other factors. For most people, a normal blood pressure is less than 120/80. Hypertension is treated with lifestyle changes, medicines, or a combination of both. Lifestyle changes include losing weight, eating a healthy,   low-sodium diet, exercising more, and limiting alcohol. This information is not intended to replace advice given to you by your health care provider. Make sure you discuss any questions you have with your health care provider. Document Revised: 01/11/2021 Document Reviewed: 01/11/2021 Elsevier Patient Education  2023 [employer]  

## 2022-01-05 NOTE — Assessment & Plan Note (Signed)
Stable.  Diet and nutrition discussed. Continue atorvastatin 40 mg daily. 

## 2022-01-09 NOTE — Therapy (Incomplete)
OUTPATIENT PHYSICAL THERAPY LOWER EXTREMITY EVALUATION   Patient Name: Eric Nielsen MRN: [id number] DOB:05-15-1960, 61 y.o., male Today's Date: 01/09/2022    Past Medical History:  Diagnosis Date   Abscess, perirectal    Arthritis    HTN (hypertension)    Hyperlipidemia    Hypertension    Past Surgical History:  Procedure Laterality Date   INCISION AND DRAINAGE PERIRECTAL ABSCESS N/A 10/14/2012   Procedure: IRRIGATION AND DEBRIDEMENT PERIRECTAL ABSCESS;  Surgeon: Zenovia Jarred, MD;  Location: [facility];  Service: General;  Laterality: N/A;   INCISION AND DRAINAGE PERIRECTAL ABSCESS N/A 01/23/2015   Procedure: EXAM UNDER ANESTHESIA;  Surgeon: Erroll Luna, MD;  Location: [facility];  Service: General;  Laterality: N/A;   Patient Active Problem List   Diagnosis Date Noted   Uncontrolled hypertension 12/08/2021   Hypertensive left ventricular hypertrophy, without heart failure 12/08/2021   Atherosclerosis of native coronary artery of native heart without angina pectoris 12/08/2021   Centrilobular emphysema ([place]) 12/08/2021   Atherosclerosis of aorta ([place]) 12/08/2021   Thrombocytopenia ([place]) 42/68/3419   Systolic murmur 62/22/9798   Dyslipidemia 05/28/2019   History of polycythemia 06/13/2017   Alcohol use 06/13/2017   Chronic bilateral low back pain without sciatica 11/02/2015   External hemorrhoid 04/09/2015   Tobacco use disorder 01/15/2015   Essential hypertension 12/24/2013   Abnormal EKG 04/18/2013    PCP: Horald Pollen, MD  REFERRING PROVIDER: Callie Fielding, MD  REFERRING DIAG:  [phone] (ICD-10-CM) - Pain in left hip  M25.551 (ICD-10-CM) - Pain in right hip    THERAPY DIAG:  No diagnosis found.  Rationale for Evaluation and Treatment Rehabilitation  ONSET DATE: ***  SUBJECTIVE:   SUBJECTIVE STATEMENT: ***  PERTINENT HISTORY: Arthritis Hypertension  PAIN:  Are you having pain? Yes: NPRS scale: ***/10 Pain location: *** Pain description:  *** Aggravating factors: *** Relieving factors: ***  PRECAUTIONS: None  WEIGHT BEARING RESTRICTIONS: No  FALLS:  Has patient fallen in last 6 months? {fallsyesno:27318}  LIVING ENVIRONMENT: Lives with: {[facility] lives with:25569::"lives with their family"} Lives in: {Lives in:25570} Stairs: {opstairs:27293} Has following equipment at home: {Assistive devices:23999}  OCCUPATION: ***  PLOF: {PLOF:24004}  PATIENT GOALS: ***   OBJECTIVE:   DIAGNOSTIC FINDINGS: unable to view hip X-ray results in epic.   PATIENT SURVEYS:  FOTO ***  COGNITION: Overall cognitive status: {cognition:24006}     SENSATION: {sensation:27233}  EDEMA:  {edema:24020}  MUSCLE LENGTH: Hamstrings: Right *** deg; Left *** deg Marcello Moores test: Right *** deg; Left *** deg  POSTURE: {posture:25561}  PALPATION: ***  LOWER EXTREMITY ROM:  {AROM/PROM:27142} ROM Right eval Left eval  Hip flexion    Hip extension    Hip abduction    Hip adduction    Hip internal rotation    Hip external rotation    Knee flexion    Knee extension    Ankle dorsiflexion    Ankle plantarflexion    Ankle inversion    Ankle eversion     (Blank rows = not tested)  LOWER EXTREMITY MMT:  MMT Right eval Left eval  Hip flexion    Hip extension    Hip abduction    Hip adduction    Hip internal rotation    Hip external rotation    Knee flexion    Knee extension    Ankle dorsiflexion    Ankle plantarflexion    Ankle inversion    Ankle eversion     (Blank rows = not tested)  LOWER EXTREMITY  SPECIAL TESTS:  {LEspecialtests:26242}  FUNCTIONAL TESTS:  {Functional tests:24029}  GAIT: Distance walked: *** Assistive device utilized: {Assistive devices:23999} Level of assistance: {Levels of assistance:24026} Comments: ***  [facility] Adult PT Treatment:                                                DATE: 01/09/22 Therapeutic Exercise: *** Manual Therapy: *** Neuromuscular re-ed: *** Therapeutic  Activity: *** Modalities: *** Self Care: ***   PATIENT EDUCATION:  Education details: see treatment  Person educated: Patient Education method: Explanation, Demonstration, Tactile cues, Verbal cues, and Handouts Education comprehension: verbalized understanding, returned demonstration, verbal cues required, tactile cues required, and needs further education  HOME EXERCISE PROGRAM: ***  ASSESSMENT:  CLINICAL IMPRESSION: Patient is a 61 y.o. male who was seen today for physical therapy evaluation and treatment for chronic bilateral hip pain.   OBJECTIVE IMPAIRMENTS: {opptimpairments:25111}.   ACTIVITY LIMITATIONS: {activitylimitations:27494}  PARTICIPATION LIMITATIONS: {participationrestrictions:25113}  PERSONAL FACTORS: {Personal factors:25162} are also affecting patient's functional outcome.   REHAB POTENTIAL: {rehabpotential:25112}  CLINICAL DECISION MAKING: {clinical decision making:25114}  EVALUATION COMPLEXITY: {Evaluation complexity:25115}   GOALS: Goals reviewed with patient? {yes/no:20286}  SHORT TERM GOALS: Target date: {follow up:25551}  *** Baseline: Goal status: {GOALSTATUS:25110}  2.  *** Baseline:  Goal status: {GOALSTATUS:25110}  3.  *** Baseline:  Goal status: {GOALSTATUS:25110}  4.  *** Baseline:  Goal status: {GOALSTATUS:25110}  5.  *** Baseline:  Goal status: {GOALSTATUS:25110}  6.  *** Baseline:  Goal status: {GOALSTATUS:25110}  LONG TERM GOALS: Target date: {follow up:25551}   *** Baseline:  Goal status: {GOALSTATUS:25110}  2.  *** Baseline:  Goal status: {GOALSTATUS:25110}  3.  *** Baseline:  Goal status: {GOALSTATUS:25110}  4.  *** Baseline:  Goal status: {GOALSTATUS:25110}  5.  *** Baseline:  Goal status: {GOALSTATUS:25110}  6.  *** Baseline:  Goal status: {GOALSTATUS:25110}   PLAN:  PT FREQUENCY: {rehab frequency:25116}  PT DURATION: {rehab duration:25117}  PLANNED INTERVENTIONS: {rehab planned  interventions:25118::"Therapeutic exercises","Therapeutic activity","Neuromuscular re-education","Balance training","Gait training","Patient/Family education","Self Care","Joint mobilization"}  PLAN FOR NEXT SESSION: ***  Gwendolyn Grant, PT, DPT, ATC 01/09/22 3:52 PM

## 2022-01-10 ENCOUNTER — Ambulatory Visit: Payer: PRIVATE HEALTH INSURANCE

## 2022-01-16 ENCOUNTER — Telehealth: Payer: Self-pay | Admitting: Emergency Medicine

## 2022-01-16 NOTE — Telephone Encounter (Signed)
Tylenol, Advil, alternate cold/hot compresses, stretching.  Urgent care center also an option.  Thanks.

## 2022-01-16 NOTE — Telephone Encounter (Signed)
Called patient wife to inform her of provider recommendations

## 2022-01-16 NOTE — Therapy (Incomplete)
OUTPATIENT PHYSICAL THERAPY LOWER EXTREMITY EVALUATION   Patient Name: Eric Nielsen MRN: [id number] DOB:[birth year], 61 y.o., male Today's Date: 01/16/2022    Past Medical History:  Diagnosis Date   Abscess, perirectal    Arthritis    HTN (hypertension)    Hyperlipidemia    Hypertension    Past Surgical History:  Procedure Laterality Date   INCISION AND DRAINAGE PERIRECTAL ABSCESS N/A 10/14/2012   Procedure: IRRIGATION AND DEBRIDEMENT PERIRECTAL ABSCESS;  Surgeon: Zenovia Jarred, MD;  Location: [place];  Service: General;  Laterality: N/A;   INCISION AND DRAINAGE PERIRECTAL ABSCESS N/A 01/23/2015   Procedure: EXAM UNDER ANESTHESIA;  Surgeon: Erroll Luna, MD;  Location: [facility] OR;  Service: General;  Laterality: N/A;   Patient Active Problem List   Diagnosis Date Noted   Uncontrolled hypertension 12/08/2021   Hypertensive left ventricular hypertrophy, without heart failure 12/08/2021   Atherosclerosis of native coronary artery of native heart without angina pectoris 12/08/2021   Centrilobular emphysema ([place]) 12/08/2021   Atherosclerosis of aorta ([place]) 12/08/2021   Thrombocytopenia ([place]) 13/24/4010   Systolic murmur 27/25/3664   Dyslipidemia 05/28/2019   History of polycythemia 06/13/2017   Alcohol use 06/13/2017   Chronic bilateral low back pain without sciatica 11/02/2015   External hemorrhoid 04/09/2015   Tobacco use disorder 01/15/2015   Essential hypertension 12/24/2013   Abnormal EKG 04/18/2013    PCP: Horald Pollen, MD  REFERRING PROVIDER: Callie Fielding, MD  REFERRING DIAG:  [phone] (ICD-10-CM) - Pain in left hip  M25.551 (ICD-10-CM) - Pain in right hip    THERAPY DIAG:  No diagnosis found.  Rationale for Evaluation and Treatment Rehabilitation  ONSET DATE: ***  SUBJECTIVE:   SUBJECTIVE STATEMENT: ***  PERTINENT HISTORY: Arthritis Hypertension  PAIN:  Are you having pain? Yes: NPRS scale: ***/10 Pain location: *** Pain description:  *** Aggravating factors: *** Relieving factors: ***  PRECAUTIONS: None  WEIGHT BEARING RESTRICTIONS: No  FALLS:  Has patient fallen in last 6 months? {fallsyesno:27318}  LIVING ENVIRONMENT: Lives with: {[facility] lives with:25569::"lives with their family"} Lives in: {Lives in:25570} Stairs: {opstairs:27293} Has following equipment at home: {Assistive devices:23999}  OCCUPATION: ***  PLOF: {PLOF:24004}  PATIENT GOALS: ***   OBJECTIVE:   DIAGNOSTIC FINDINGS: unable to view hip X-ray results in epic.   PATIENT SURVEYS:  FOTO ***  COGNITION: Overall cognitive status: {cognition:24006}     SENSATION: {sensation:27233}  EDEMA:  {edema:24020}  MUSCLE LENGTH: Hamstrings: Right *** deg; Left *** deg Marcello Moores test: Right *** deg; Left *** deg  POSTURE: {posture:25561}  PALPATION: ***  LOWER EXTREMITY ROM:  {AROM/PROM:27142} ROM Right eval Left eval  Hip flexion    Hip extension    Hip abduction    Hip adduction    Hip internal rotation    Hip external rotation    Knee flexion    Knee extension    Ankle dorsiflexion    Ankle plantarflexion    Ankle inversion    Ankle eversion     (Blank rows = not tested)  LOWER EXTREMITY MMT:  MMT Right eval Left eval  Hip flexion    Hip extension    Hip abduction    Hip adduction    Hip internal rotation    Hip external rotation    Knee flexion    Knee extension    Ankle dorsiflexion    Ankle plantarflexion    Ankle inversion    Ankle eversion     (Blank rows = not tested)  LOWER EXTREMITY  SPECIAL TESTS:  {LEspecialtests:26242}  FUNCTIONAL TESTS:  {Functional tests:24029}  GAIT: Distance walked: *** Assistive device utilized: {Assistive devices:23999} Level of assistance: {Levels of assistance:24026} Comments: ***  [facility] Adult PT Treatment:                                                DATE: 01/09/22 Therapeutic Exercise: *** Manual Therapy: *** Neuromuscular re-ed: *** Therapeutic  Activity: *** Modalities: *** Self Care: ***   PATIENT EDUCATION:  Education details: see treatment  Person educated: Patient Education method: Explanation, Demonstration, Tactile cues, Verbal cues, and Handouts Education comprehension: verbalized understanding, returned demonstration, verbal cues required, tactile cues required, and needs further education  HOME EXERCISE PROGRAM: ***  ASSESSMENT:  CLINICAL IMPRESSION: Patient is a 61 y.o. male who was seen today for physical therapy evaluation and treatment for chronic bilateral hip pain.   OBJECTIVE IMPAIRMENTS: {opptimpairments:25111}.   ACTIVITY LIMITATIONS: {activitylimitations:27494}  PARTICIPATION LIMITATIONS: {participationrestrictions:25113}  PERSONAL FACTORS: {Personal factors:25162} are also affecting patient's functional outcome.   REHAB POTENTIAL: {rehabpotential:25112}  CLINICAL DECISION MAKING: {clinical decision making:25114}  EVALUATION COMPLEXITY: {Evaluation complexity:25115}   GOALS: Goals reviewed with patient? {yes/no:20286}  SHORT TERM GOALS: Target date: {follow up:25551}  *** Baseline: Goal status: {GOALSTATUS:25110}  2.  *** Baseline:  Goal status: {GOALSTATUS:25110}  3.  *** Baseline:  Goal status: {GOALSTATUS:25110}  4.  *** Baseline:  Goal status: {GOALSTATUS:25110}  5.  *** Baseline:  Goal status: {GOALSTATUS:25110}  6.  *** Baseline:  Goal status: {GOALSTATUS:25110}  LONG TERM GOALS: Target date: {follow up:25551}   *** Baseline:  Goal status: {GOALSTATUS:25110}  2.  *** Baseline:  Goal status: {GOALSTATUS:25110}  3.  *** Baseline:  Goal status: {GOALSTATUS:25110}  4.  *** Baseline:  Goal status: {GOALSTATUS:25110}  5.  *** Baseline:  Goal status: {GOALSTATUS:25110}  6.  *** Baseline:  Goal status: {GOALSTATUS:25110}   PLAN:  PT FREQUENCY: {rehab frequency:25116}  PT DURATION: {rehab duration:25117}  PLANNED INTERVENTIONS: {rehab planned  interventions:25118::"Therapeutic exercises","Therapeutic activity","Neuromuscular re-education","Balance training","Gait training","Patient/Family education","Self Care","Joint mobilization"}  PLAN FOR NEXT SESSION: ***  Gwendolyn Grant, PT, DPT, ATC 01/16/22 3:24 PM

## 2022-01-16 NOTE — Telephone Encounter (Addendum)
Wife, Jana Half, called to report pt lying on the bed with severe back pain and RT leg severe cramps. RT leg has visible knots all over the back of it per wide. Requesting advice as to what they can do. Please call to advise.  Wife request quick turnaround due to severe acute pain.

## 2022-01-17 ENCOUNTER — Ambulatory Visit (HOSPITAL_COMMUNITY)
Admission: EM | Admit: 2022-01-17 | Discharge: 2022-01-17 | Disposition: A | Discharge status: Home / Self Care | Payer: PRIVATE HEALTH INSURANCE | Attending: Emergency Medicine | Admitting: Emergency Medicine

## 2022-01-17 ENCOUNTER — Emergency Department (HOSPITAL_COMMUNITY)
Admission: EM | Admit: 2022-01-17 | Discharge: 2022-01-17 | Discharge status: Against Medical Advice | Payer: 59 | Attending: Emergency Medicine | Admitting: Emergency Medicine

## 2022-01-17 ENCOUNTER — Ambulatory Visit: Payer: PRIVATE HEALTH INSURANCE | Attending: Orthopedic Surgery

## 2022-01-17 ENCOUNTER — Emergency Department (HOSPITAL_COMMUNITY): Payer: 59

## 2022-01-17 ENCOUNTER — Encounter (HOSPITAL_COMMUNITY): Payer: Self-pay | Admitting: *Deleted

## 2022-01-17 ENCOUNTER — Other Ambulatory Visit: Payer: Self-pay

## 2022-01-17 DIAGNOSIS — R252 Cramp and spasm: Secondary | ICD-10-CM | POA: Insufficient documentation

## 2022-01-17 DIAGNOSIS — Z5321 Procedure and treatment not carried out due to patient leaving prior to being seen by health care provider: Secondary | ICD-10-CM | POA: Insufficient documentation

## 2022-01-17 DIAGNOSIS — M5441 Lumbago with sciatica, right side: Secondary | ICD-10-CM | POA: Diagnosis not present

## 2022-01-17 DIAGNOSIS — R9431 Abnormal electrocardiogram [ECG] [EKG]: Secondary | ICD-10-CM | POA: Diagnosis not present

## 2022-01-17 DIAGNOSIS — R42 Dizziness and giddiness: Secondary | ICD-10-CM | POA: Diagnosis not present

## 2022-01-17 LAB — I-STAT CHEM 8, ED
BUN: 18 mg/dL (ref 6–20)
Calcium, Ion: 1.13 mmol/L — ABNORMAL LOW (ref 1.15–1.40)
Chloride: 100 mmol/L (ref 98–111)
Creatinine, Ser: 1.1 mg/dL (ref 0.61–1.24)
Glucose, Bld: 171 mg/dL — ABNORMAL HIGH (ref 70–99)
HCT: 61 % — ABNORMAL HIGH (ref 39.0–52.0)
Hemoglobin: 20.7 g/dL — ABNORMAL HIGH (ref 13.0–17.0)
Potassium: 3.6 mmol/L (ref 3.5–5.1)
Sodium: 137 mmol/L (ref 135–145)
TCO2: 23 mmol/L (ref 22–32)

## 2022-01-17 LAB — TROPONIN I (HIGH SENSITIVITY): Troponin I (High Sensitivity): 94 ng/L — ABNORMAL HIGH (ref <18)

## 2022-01-17 LAB — CBC
HCT: 57.6 % — ABNORMAL HIGH (ref 39.0–52.0)
Hemoglobin: 21 g/dL — ABNORMAL HIGH (ref 13.0–17.0)
MCH: 33.3 pg (ref 26.0–34.0)
MCHC: 36.5 g/dL — ABNORMAL HIGH (ref 30.0–36.0)
MCV: 91.4 fL (ref 80.0–100.0)
Platelets: 154 10*3/uL (ref 150–400)
RBC: 6.3 MIL/uL — ABNORMAL HIGH (ref 4.22–5.81)
RDW: 11.9 % (ref 11.5–15.5)
WBC: 8.3 10*3/uL (ref 4.0–10.5)
nRBC: 0 % (ref 0.0–0.2)

## 2022-01-17 LAB — BASIC METABOLIC PANEL
Anion gap: 16 — ABNORMAL HIGH (ref 5–15)
BUN: 16 mg/dL (ref 6–20)
CO2: 23 mmol/L (ref 22–32)
Calcium: 10.3 mg/dL (ref 8.9–10.3)
Chloride: 99 mmol/L (ref 98–111)
Creatinine, Ser: 1.35 mg/dL — ABNORMAL HIGH (ref 0.61–1.24)
GFR, Estimated: >60 mL/min (ref >60)
Glucose, Bld: 169 mg/dL — ABNORMAL HIGH (ref 70–99)
Potassium: 3.5 mmol/L (ref 3.5–5.1)
Sodium: 138 mmol/L (ref 135–145)

## 2022-01-17 LAB — MAGNESIUM: Magnesium: 2 mg/dL (ref 1.7–2.4)

## 2022-01-17 MED ORDER — KETOROLAC TROMETHAMINE 30 MG/ML IJ SOLN
30.0000 mg | Freq: Once | INTRAMUSCULAR | Status: AC
  Administered 2022-01-17: 30 mg via INTRAMUSCULAR

## 2022-01-17 MED ORDER — METHOCARBAMOL 500 MG PO TABS: 500.0000 mg | ORAL_TABLET | Freq: Two times a day (BID) | ORAL | 0 refills | Status: DC

## 2022-01-17 MED ORDER — PREDNISONE 20 MG PO TABS: 20.0000 mg | ORAL_TABLET | Freq: Every day | ORAL | 0 refills | Status: DC

## 2022-01-17 MED ORDER — KETOROLAC TROMETHAMINE 30 MG/ML IJ SOLN
INTRAMUSCULAR | Status: AC
  Filled 2022-01-17: qty 1

## 2022-01-17 NOTE — ED Notes (Signed)
Patient is being discharged from the Urgent Care and sent to the Emergency Department via POV . Per Juventino Slovak, NP, patient is in need of higher level of care due to higher level of care needed. Patient is aware and verbalizes understanding of plan of care.  Vitals:   01/17/22 0826 01/17/22 0944  BP: (!) 188/103 (!) 204/113  Pulse: (!) 109   Resp: 18   Temp: 97.9 F (36.6 C)   SpO2: 95%

## 2022-01-17 NOTE — ED Notes (Signed)
The patient has been called for updated vitals x3 and 2nd troponin with no answer

## 2022-01-17 NOTE — ED Provider Notes (Addendum)
[facility]    CSN: [id number] Arrival date & time: 01/17/22  0803      History   Chief Complaint Chief Complaint  Patient presents with   Abdominal Cramping    HPI Eric Nielsen is a 61 y.o. male.  Patient presents complaining of right-sided lower back pain and right leg pain that started yesterday.  Patient denies any fall or trauma.  Patient reports tingling and cramping right leg.  Patient reports pain is worsened by raising from a chair or ambulation.  Patient reports history of similar symptoms due to chronic back pain.  Patient denies any history of back surgery.  Patient has taken Tylenol and a muscle relaxant with no relief of symptoms.   Patient reports lightheadedness that occurred last night.  Patient reports feeling unwell and having cramping.  Patient reports a history of similar symptoms where he was admitted to the hospital.  Patient denies any chest pain or shortness of breath.  Patient reports that he has an initial cardiology appointment scheduled for November 9.     Abdominal Cramping Pertinent negatives include no chest pain and no shortness of breath.    Past Medical History:  Diagnosis Date   Abscess, perirectal    Arthritis    HTN (hypertension)    Hyperlipidemia    Hypertension     Patient Active Problem List   Diagnosis Date Noted   Uncontrolled hypertension 12/08/2021   Hypertensive left ventricular hypertrophy, without heart failure 12/08/2021   Atherosclerosis of native coronary artery of native heart without angina pectoris 12/08/2021   Centrilobular emphysema ([place]) 12/08/2021   Atherosclerosis of aorta ([place]) 12/08/2021   Thrombocytopenia ([place]) 08/65/7846   Systolic murmur 96/29/5284   Dyslipidemia 05/28/2019   History of polycythemia 06/13/2017   Alcohol use 06/13/2017   Chronic bilateral low back pain without sciatica 11/02/2015   External hemorrhoid 04/09/2015   Tobacco use disorder 01/15/2015   Essential hypertension  12/24/2013   Abnormal EKG 04/18/2013    Past Surgical History:  Procedure Laterality Date   INCISION AND DRAINAGE PERIRECTAL ABSCESS N/A 10/14/2012   Procedure: IRRIGATION AND DEBRIDEMENT PERIRECTAL ABSCESS;  Surgeon: Zenovia Jarred, MD;  Location: [place];  Service: General;  Laterality: N/A;   INCISION AND DRAINAGE PERIRECTAL ABSCESS N/A 01/23/2015   Procedure: EXAM UNDER ANESTHESIA;  Surgeon: Erroll Luna, MD;  Location: [place];  Service: General;  Laterality: N/A;       Home Medications    Prior to Admission medications   Medication Sig Start Date End Date Taking? Authorizing Provider  amLODipine (NORVASC) 10 MG tablet Take 1 tablet (10 mg total) by mouth daily. 12/08/21  Yes Sagardia, Ines Bloomer, MD  atorvastatin (LIPITOR) 40 MG tablet Take 1 tablet (40 mg total) by mouth daily. 12/08/21  Yes Sagardia, Ines Bloomer, MD  Blood Pressure Monitoring (BLOOD PRESSURE KIT) DEVI 1 kit by Does not apply route 2 (two) times daily. 06/02/19  Yes Milus Banister C, DO  diclofenac Sodium (VOLTAREN) 1 % GEL Apply 2 g topically 4 (four) times daily. 09/24/19  Yes Daisy Floro, DO  methocarbamol (ROBAXIN) 500 MG tablet Take 1 tablet (500 mg total) by mouth 2 (two) times daily. 01/17/22  Yes Flossie Dibble, NP  predniSONE (DELTASONE) 20 MG tablet Take 1 tablet (20 mg total) by mouth daily. 01/17/22  Yes Flossie Dibble, NP  valsartan-hydrochlorothiazide (DIOVAN-HCT) 160-12.5 MG tablet Take 1 tablet by mouth daily. 12/08/21  Yes Sagardia, Ines Bloomer, MD    Family  History Family History  Problem Relation Age of Onset   Hypertension Mother    Cancer Mother        Lung and breast and brain   Heart failure Father    Hypertension Father    Hypertension Brother    Diabetes Brother    Colon cancer Neg Hx     Social History Social History   Tobacco Use   Smoking status: Heavy Smoker    Packs/day: 1.00    Types: Cigarettes   Smokeless tobacco: Never  Vaping Use   Vaping Use:  Never used  Substance Use Topics   Alcohol use: Yes    Alcohol/week: 4.0 standard drinks of alcohol    Types: 4 Cans of beer per week   Drug use: Never     Allergies   Patient has no known allergies.   Review of Systems Review of Systems  Constitutional:  Positive for activity change and fatigue. Negative for chills and fever.  Respiratory:  Negative for cough and shortness of breath.   Cardiovascular:  Negative for chest pain.  Gastrointestinal: Negative.   Musculoskeletal:  Positive for back pain and myalgias.  Neurological:  Positive for dizziness, weakness and light-headedness. Negative for syncope.  Psychiatric/Behavioral:  Negative for confusion.      Physical Exam Triage Vital Signs ED Triage Vitals  Enc Vitals Group     BP 01/17/22 0826 (!) 188/103     Pulse Rate 01/17/22 0826 (!) 109     Resp 01/17/22 0826 18     Temp 01/17/22 0826 97.9 F (36.6 C)     Temp Source 01/17/22 0826 Oral     SpO2 01/17/22 0826 95 %     Weight --      Height --      Head Circumference --      Peak Flow --      Pain Score 01/17/22 0824 9     Pain Loc --      Pain Edu? --      Excl. in [place]? --    No data found.  Updated Vital Signs BP (!) 204/113 (BP Location: Left Arm)   Pulse (!) 109   Temp 97.9 F (36.6 C) (Oral)   Resp 18   SpO2 95%      Physical Exam Vitals and nursing note reviewed.  Constitutional:      Appearance: He is diaphoretic.  Cardiovascular:     Rate and Rhythm: Rhythm regularly irregular.     Heart sounds: Murmur heard.  Pulmonary:     Effort: Pulmonary effort is normal.     Breath sounds: Normal breath sounds and air entry. No decreased breath sounds, wheezing, rhonchi or rales.  Musculoskeletal:     Cervical back: Normal.     Thoracic back: Normal.     Lumbar back: Spasms present. No swelling, edema, deformity, signs of trauma, lacerations, tenderness or bony tenderness. Decreased range of motion. Positive right straight leg raise test. Negative  left straight leg raise test. No scoliosis.  Neurological:     General: No focal deficit present.     Mental Status: He is alert and oriented to person, place, and time.      UC Treatments / Results  Labs (all labs ordered are listed, but only abnormal results are displayed) Labs Reviewed - No data to display  EKG   Radiology No results found.  Procedures Procedures (including critical care time)  Medications Ordered in UC Medications  ketorolac (TORADOL) 30  MG/ML injection 30 mg (30 mg Intramuscular Given 01/17/22 0853)    Initial Impression / Assessment and Plan / UC Course  I have reviewed the triage vital signs and the nursing notes.  Pertinent labs & imaging results that were available during my care of the patient were reviewed by me and considered in my medical decision making (see chart for details).   Patient was sent to the emergency department for further evaluation due to EKG changes from previous EKG on 12/08/2021, hypertension last blood pressure was 204/113 , lightheadedness, and generalized body cramping.  Patient reports that this episode of symptoms like this required hospitalization.  Patient has pertinent medical history. Patient's wife is at bedside and will transport patient to hospital.  Patient and patient's wife verbalized understanding of instructions.   Patient was evaluated for right-sided lower back pain with right-sided sciatica.  Toradol injection was given in office.  Prednisone and Robaxin was sent to the pharmacy.  Patient was made aware of possible side effects of medication.  Patient was made aware of safety precautions with muscle relaxant.  Patient verbalized understanding of instructions.    Final Clinical Impressions(s) / UC Diagnoses   Final diagnoses:  Right-sided low back pain with right-sided sciatica, unspecified chronicity  Lightheadedness  EKG abnormalities     Discharge Instructions      Please go to the nearest emergency  department due to changes in your EKG and lightheadedness that you have been experiencing and high blood pressure.    Prednisone has been sent to the pharmacy, you can take this medication in the mornings for the next 5 days.  Please be mindful that this medication can cause that to be difficult to sleep at night so advised that you take this first thing in the morning.  Robaxin is a muscle relaxant, has been sent to the pharmacy, you can take this medication 2 times daily.  Please be mindful that this medication can make you sleepy, so advised that you do not drive or operate any heavy machinery after taking this medication.  Please start checking your blood pressure at home, please ensure that your blood pressure is below 140/90, if your blood pressures are consistently over this range you will need to follow-up with your PCP for changes in your plan of care.      ED Prescriptions     Medication Sig Dispense Auth. Provider   predniSONE (DELTASONE) 20 MG tablet Take 1 tablet (20 mg total) by mouth daily. 5 tablet Flossie Dibble, NP   methocarbamol (ROBAXIN) 500 MG tablet Take 1 tablet (500 mg total) by mouth 2 (two) times daily. 20 tablet Flossie Dibble, NP      PDMP not reviewed this encounter.   Flossie Dibble, NP 01/17/22 0950    Flossie Dibble, NP 01/17/22 [phone]

## 2022-01-17 NOTE — ED Triage Notes (Signed)
Pt. Stated, I did feel lightheaded, not now. Im having stomach cramps , and my back hurts. This started yesterday after I got off work yesterday.

## 2022-01-17 NOTE — ED Triage Notes (Signed)
Pt states he has cramping all over his body even his toes and his right leg feels numb. It started yesterday and they called PCP which advised give tylenol that didn't help so they brought him in. He took some muscle relaxers and no help.

## 2022-01-17 NOTE — ED Provider Triage Note (Signed)
Emergency Medicine Provider Triage Evaluation Note  Eric Nielsen , a 61 y.o. male  was evaluated in triage.  Pt complains of back and leg cramps.  Denies recent nausea, vomiting, diarrhea.  He has been eating and drinking okay.  Went to urgent care and was sent to the emergency department due to abnormal EKG.  No shortness of breath or chest pain.  No lightheadedness or syncope.  Review of Systems  Positive: Muscle cramps Negative: Abdominal pain, chest pain  Physical Exam  BP (!) 198/107   Pulse 97   Temp 98.2 F (36.8 C) (Oral)   Resp 16   SpO2 96%  Gen:   Awake, appears mildly uncomfortable Resp:  Normal effort  MSK:   Moves extremities without difficulty  Other:  Mild tachycardia  Medical Decision Making  Medically screening exam initiated at 10:39 AM.  Appropriate orders placed.  Eric Nielsen was informed that the remainder of the evaluation will be completed by another provider, this initial triage assessment does not replace that evaluation, and the importance of remaining in the ED until their evaluation is complete.     [place] Cater, PA-C 01/17/22 1041

## 2022-01-17 NOTE — Discharge Instructions (Addendum)
Please go to the nearest emergency department due to changes in your EKG and lightheadedness that you have been experiencing and high blood pressure.    Prednisone has been sent to the pharmacy, you can take this medication in the mornings for the next 5 days.  Please be mindful that this medication can cause that to be difficult to sleep at night so advised that you take this first thing in the morning.  Robaxin is a muscle relaxant, has been sent to the pharmacy, you can take this medication 2 times daily.  Please be mindful that this medication can make you sleepy, so advised that you do not drive or operate any heavy machinery after taking this medication.  Please start checking your blood pressure at home, please ensure that your blood pressure is below 140/90, if your blood pressures are consistently over this range you will need to follow-up with your PCP for changes in your plan of care.

## 2022-01-18 ENCOUNTER — Encounter (HOSPITAL_COMMUNITY): Payer: Self-pay | Admitting: Emergency Medicine

## 2022-01-18 ENCOUNTER — Telehealth: Payer: Self-pay | Admitting: Emergency Medicine

## 2022-01-18 ENCOUNTER — Emergency Department (HOSPITAL_COMMUNITY)
Admission: EM | Admit: 2022-01-18 | Discharge: 2022-01-18 | Disposition: A | Discharge status: Home / Self Care | Payer: 59 | Attending: Emergency Medicine | Admitting: Emergency Medicine

## 2022-01-18 ENCOUNTER — Other Ambulatory Visit: Payer: Self-pay

## 2022-01-18 DIAGNOSIS — I10 Essential (primary) hypertension: Secondary | ICD-10-CM | POA: Diagnosis not present

## 2022-01-18 DIAGNOSIS — Z79899 Other long term (current) drug therapy: Secondary | ICD-10-CM | POA: Insufficient documentation

## 2022-01-18 DIAGNOSIS — M79604 Pain in right leg: Secondary | ICD-10-CM | POA: Insufficient documentation

## 2022-01-18 MED ORDER — DEXAMETHASONE SODIUM PHOSPHATE 10 MG/ML IJ SOLN
10.0000 mg | Freq: Once | INTRAMUSCULAR | Status: AC
  Administered 2022-01-18: 10 mg via INTRAMUSCULAR

## 2022-01-18 MED ORDER — DEXAMETHASONE SODIUM PHOSPHATE 10 MG/ML IJ SOLN
10.0000 mg | Freq: Once | INTRAMUSCULAR | Status: DC
  Filled 2022-01-18: qty 1

## 2022-01-18 NOTE — ED Provider Triage Note (Signed)
Emergency Medicine Provider Triage Evaluation Note  Eric Nielsen , a 61 y.o. male  was evaluated in triage.  Pt complains of right anterior thigh numbness and pain which began on Monday.  Patient with known degenerative disc issues in his lumbar region and known end-stage osteoarthritis of bilateral hips.  Patient states that he was seen at urgent care yesterday and was given a Toradol injection and prescribed prednisone.  He initially went to the emergency department yesterday due to some other symptoms including stomach cramps, lightheadedness, abnormal EKG but states that all of that resolved yesterday.  He left the emergency department yesterday prior to being seen in the back due to to his wait time.  Today he complains only of the right leg pain.  He denies any new trauma to the low back, saddle anesthesia, urinary incontinence or retention, fecal incontinence  Review of Systems  Positive: As above Negative: As above  Physical Exam  BP (!) 151/87   Pulse 91   Temp 98.2 F (36.8 C) (Oral)   Resp 16   SpO2 93%  Gen:   Awake, no distress   Resp:  Normal effort  MSK:   Moves extremities without difficulty  Other:    Medical Decision Making  Medically screening exam initiated at 12:11 PM.  Appropriate orders placed.  Eric Nielsen was informed that the remainder of the evaluation will be completed by another provider, this initial triage assessment does not replace that evaluation, and the importance of remaining in the ED until their evaluation is complete.     Dorothyann Peng, PA-C 01/18/22 1214

## 2022-01-18 NOTE — ED Provider Notes (Signed)
[facility] DEPT Provider Note   CSN: [id number] Arrival date & time: 01/18/22  1110     History  Chief Complaint  Patient presents with   Leg Pain    Sir Po is a 61 y.o. male.  Patient presents to the emergency department complaining of right-sided thigh pain and numbness.  Patient states he has a history of degenerative disc issues in his lower back and also has end-stage arthritis of both hips.  He denies any new injury or trauma to the area.  States this pain began yesterday.  Denies weakness in the leg.  Patient also denies saddle anesthesia, urinary incontinence, urinary retention, fecal incontinence.  Past medical history otherwise significant for arthritis, hypertension  HPI     Home Medications Prior to Admission medications   Medication Sig Start Date End Date Taking? Authorizing Provider  amLODipine (NORVASC) 10 MG tablet Take 1 tablet (10 mg total) by mouth daily. 12/08/21   Horald Pollen, MD  atorvastatin (LIPITOR) 40 MG tablet Take 1 tablet (40 mg total) by mouth daily. 12/08/21   Horald Pollen, MD  Blood Pressure Monitoring (BLOOD PRESSURE KIT) DEVI 1 kit by Does not apply route 2 (two) times daily. 06/02/19   Daisy Floro, DO  diclofenac Sodium (VOLTAREN) 1 % GEL Apply 2 g topically 4 (four) times daily. 09/24/19   Daisy Floro, DO  methocarbamol (ROBAXIN) 500 MG tablet Take 1 tablet (500 mg total) by mouth 2 (two) times daily. 01/17/22   Flossie Dibble, NP  predniSONE (DELTASONE) 20 MG tablet Take 1 tablet (20 mg total) by mouth daily. 01/17/22   Flossie Dibble, NP  valsartan-hydrochlorothiazide (DIOVAN-HCT) 160-12.5 MG tablet Take 1 tablet by mouth daily. 12/08/21   Horald Pollen, MD      Allergies    Patient has no known allergies.    Review of Systems   Review of Systems  Musculoskeletal:  Positive for back pain and myalgias.    Physical Exam Updated Vital Signs BP (!) 151/87    Pulse 91   Temp 98.2 F (36.8 C) (Oral)   Resp 16   SpO2 93%  Physical Exam Vitals and nursing note reviewed.  Constitutional:      General: He is not in acute distress.    Appearance: He is well-developed.  HENT:     Head: Normocephalic and atraumatic.     Mouth/Throat:     Mouth: Mucous membranes are moist.  Eyes:     Conjunctiva/sclera: Conjunctivae normal.  Cardiovascular:     Rate and Rhythm: Normal rate and regular rhythm.     Heart sounds: No murmur heard. Pulmonary:     Effort: Pulmonary effort is normal. No respiratory distress.     Breath sounds: Normal breath sounds.  Abdominal:     Palpations: Abdomen is soft.     Tenderness: There is no abdominal tenderness.  Musculoskeletal:        General: No swelling, tenderness, deformity or signs of injury. Normal range of motion.     Cervical back: Neck supple.     Right lower leg: No edema.     Left lower leg: No edema.     Comments: Positive right-sided straight leg raise  Skin:    General: Skin is warm and dry.     Capillary Refill: Capillary refill takes less than 2 seconds.  Neurological:     General: No focal deficit present.     Mental Status: He is  alert.  Psychiatric:        Mood and Affect: Mood normal.     ED Results / Procedures / Treatments   Labs (all labs ordered are listed, but only abnormal results are displayed) Labs Reviewed - No data to display  EKG None  Radiology DG Chest 2 View  Result Date: 01/17/2022 CLINICAL DATA:  Chest pain EXAM: CHEST - 2 VIEW COMPARISON:  07/23/2019 FINDINGS: Interstitial coarsening. There is no edema, consolidation, effusion, or pneumothorax. Normal heart size and mediastinal contours. IMPRESSION: No active cardiopulmonary disease. Electronically Signed   By: Jorje Guild M.D.   On: 01/17/2022 11:00    Procedures Procedures    Medications Ordered in ED Medications  dexamethasone (DECADRON) injection 10 mg (has no administration in time range)    ED  Course/ Medical Decision Making/ A&P                           Medical Decision Making  Patient presents with chief complaint of right-sided leg pain.  Differential diagnosis includes radicular pain, referred pain from arthritis, fracture, dislocation, soft tissue injury  I viewed the patient's past medical history including urgent care notes from yesterday with the patient was given a Toradol injection for right-sided low back pain.  Patient also went to emergency room yesterday due to a possible abnormal EKG but left prior to being seen after the MSE process  I see no indication to repeat labs today.  I reviewed lab work completed yesterday which shows that hemoglobin of 20.7.  Otherwise grossly unremarkable CMP and CBC.  The patient has no red flag symptoms to suggest imaging of the low back today.  Chest x-ray as of yesterday showed no active cardiopulmonary disease  The patient examination and history are consistent with radicular pain on the right side.  Patient was given Toradol yesterday prescribed prednisone.  I plan to add a steroid injection today.  Patient was given a Decadron injection today for inflammation.  Upon reassessment patient was feeling about the same.  I sent medication at this time for admission.  Patient may continue to take the prescribed prednisone and discharged home.  This appears to be radicular pain.  There is no new injury to suggest fracture or, dislocation, or soft tissue injury.  Patient does have known end-stage arthritis of bilateral hips but has no pain in the right groin today with passive range of motion of the right hip.  Patient to follow-up with orthopedics for hip pain and neurosurgery for radicular pain.        Final Clinical Impression(s) / ED Diagnoses Final diagnoses:  Right leg pain    Rx / DC Orders ED Discharge Orders     None         Ronny Bacon 01/18/22 1306    Long, Wonda Olds, MD 01/23/22 [phone]

## 2022-01-18 NOTE — Discharge Instructions (Signed)
You were seen today for right leg pain which appears to be originating in your lower back.  Please take the steroids as prescribed by urgent care yesterday.  I do recommend calling and following up with neurosurgery for further evaluation and management.  As you state that you have no previous back doctor I have provided contact information for the on-call provider.  If you develop any life-threatening symptoms please return to the emergency department

## 2022-01-18 NOTE — ED Triage Notes (Signed)
Pt reports pain in right leg from hip into knee. Pt reports a numbness feeling as well just in that area.

## 2022-01-18 NOTE — Telephone Encounter (Signed)
He is in the emergency department today.  They can go over the results with him.

## 2022-01-18 NOTE — Telephone Encounter (Signed)
Patient went to hospital yesterday and would like to know the results of his blood work and ekg - patient did not wait to finish his appointment.  Please call patient.

## 2022-01-19 ENCOUNTER — Ambulatory Visit: Payer: PRIVATE HEALTH INSURANCE | Admitting: Emergency Medicine

## 2022-01-26 ENCOUNTER — Ambulatory Visit: Payer: PRIVATE HEALTH INSURANCE | Admitting: Interventional Cardiology

## 2022-02-23 ENCOUNTER — Encounter: Payer: Self-pay | Admitting: Interventional Cardiology

## 2022-02-23 ENCOUNTER — Ambulatory Visit: Payer: PRIVATE HEALTH INSURANCE | Attending: Interventional Cardiology | Admitting: Interventional Cardiology

## 2022-02-23 VITALS — BP 142/62 | HR 66 | Ht 71.0 in | Wt 184.8 lb

## 2022-02-23 DIAGNOSIS — E782 Mixed hyperlipidemia: Secondary | ICD-10-CM

## 2022-02-23 DIAGNOSIS — I517 Cardiomegaly: Secondary | ICD-10-CM | POA: Diagnosis not present

## 2022-02-23 DIAGNOSIS — I351 Nonrheumatic aortic (valve) insufficiency: Secondary | ICD-10-CM | POA: Diagnosis not present

## 2022-02-23 DIAGNOSIS — I10 Essential (primary) hypertension: Secondary | ICD-10-CM | POA: Diagnosis not present

## 2022-02-23 NOTE — Patient Instructions (Signed)
Medication Instructions:  Your physician recommends that you continue on your current medications as directed. Please refer to the Current Medication list given to you today.  *If you need a refill on your cardiac medications before your next appointment, please call your pharmacy*   Lab Work: none If you have labs (blood work) drawn today and your tests are completely normal, you will receive your results only by: [facility] (if you have MyChart) OR A paper copy in the mail If you have any lab test that is abnormal or we need to change your treatment, we will call you to review the results.   Testing/Procedures: Your physician has requested that you have an echocardiogram. Echocardiography is a painless test that uses sound waves to create images of your heart. It provides your doctor with information about the size and shape of your heart and how well your heart's chambers and valves are working. This procedure takes approximately one hour. There are no restrictions for this procedure. Please do NOT wear cologne, perfume, aftershave, or lotions (deodorant is allowed). Please arrive 15 minutes prior to your appointment time.    Follow-Up: At [facility], you and your health needs are our priority.  As part of our continuing mission to provide you with exceptional heart care, we have created designated Provider Care Teams.  These Care Teams include your primary Cardiologist (physician) and Advanced Practice Providers (APPs -  Physician Assistants and Nurse Practitioners) who all work together to provide you with the care you need, when you need it.  We recommend signing up for the patient portal called "MyChart".  Sign up information is provided on this After Visit Summary.  MyChart is used to connect with patients for Virtual Visits (Telemedicine).  Patients are able to view lab/test results, encounter notes, upcoming appointments, etc.  Non-urgent messages can be sent to your  provider as well.   To learn more about what you can do with MyChart, go to [email or web address].    Your next appointment:   12 month(s)  The format for your next appointment:   In Person  Provider:   Larae Grooms, MD     Other Instructions  You have been referred to see pharmacist in the hypertension clinic in our office.  Please schedule appointment for about 3-4 weeks from now. Check blood pressure at home and keep record of readings.  Bring readings and BP cuff to appointment with the pharmacist.    Important Information About Sugar

## 2022-02-23 NOTE — Progress Notes (Signed)
Cardiology Office Note   Date:  02/23/2022   ID:  Eric Nielsen, DOB Nov 07, 1960, MRN [id number]  PCP:  Horald Pollen, MD    No chief complaint on file.    Wt Readings from Last 3 Encounters:  02/23/22 184 lb 12.8 oz (83.8 kg)  01/17/22 185 lb (83.9 kg)  01/05/22 184 lb 8 oz (83.7 kg)       History of Present Illness: Eric Nielsen is a 61 y.o. male who is being seen today for the evaluation of HTN at the request of Horald Pollen, *.   Had acute back pain when lifting something in 9/23.  A few hours later, he had diffuse muscle spasms (back , legs, hips, hands) so went to the ER.  BP was high as well.   ER records from 11/23 show: "Patient presents complaining of right-sided lower back pain and right leg pain that started yesterday.  Patient denies any fall or trauma.  Patient reports tingling and cramping right leg.  Patient reports pain is worsened by raising from a chair or ambulation.  Patient reports history of similar symptoms due to chronic back pain.  Patient denies any history of back surgery.  Patient has taken Tylenol and a muscle relaxant with no relief of symptoms.    Patient reports lightheadedness that occurred last night.  Patient reports feeling unwell and having cramping.  Patient reports a history of similar symptoms where he was admitted to the hospital.  Patient denies any chest pain or shortness of breath. "  More recently, he notes the pain mostly in the right leg.  Using a cream to help with pain.  Applies it daily before work.  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    In the past few weeks, Denies : Chest pain. Dizziness. Leg edema. Nitroglycerin use. Orthopnea. Palpitations. Paroxysmal nocturnal dyspnea. Shortness of breath. Syncope.    BP has been better at home of late.   BP reading at home has been in the 140s-150/70s.   Was 115 systolic in ER.    Past Medical History:  Diagnosis Date   Abscess, perirectal    Alcohol use     Aortic atherosclerosis (HCC)    Arthritis    Atherosclerosis of coronary artery    Centrilobular emphysema (HCC)    Chronic back pain    Current smoker    External hemorrhoid    HTN (hypertension)    Hyperlipidemia    Hypertension    Hypertensive left ventricular hypertrophy    Low back pain    Thrombocytopenia ([place])     Past Surgical History:  Procedure Laterality Date   INCISION AND DRAINAGE PERIRECTAL ABSCESS N/A 10/14/2012   Procedure: IRRIGATION AND DEBRIDEMENT PERIRECTAL ABSCESS;  Surgeon: Zenovia Jarred, MD;  Location: [facility];  Service: General;  Laterality: N/A;   INCISION AND DRAINAGE PERIRECTAL ABSCESS N/A 01/23/2015   Procedure: EXAM UNDER ANESTHESIA;  Surgeon: Erroll Luna, MD;  Location: [facility] OR;  Service: General;  Laterality: N/A;     Current Outpatient Medications  Medication Sig Dispense Refill   amLODipine (NORVASC) 10 MG tablet Take 1 tablet (10 mg total) by mouth daily. 30 tablet 3   atorvastatin (LIPITOR) 40 MG tablet Take 1 tablet (40 mg total) by mouth daily. 90 tablet 3   Blood Pressure Monitoring (BLOOD PRESSURE KIT) DEVI 1 kit by Does not apply route 2 (two) times daily. 1 each 0   diclofenac Sodium (VOLTAREN) 1 % GEL  Apply 2 g topically 4 (four) times daily. 100 g 1   gabapentin (NEURONTIN) 300 MG capsule Take 300 mg by mouth 3 (three) times daily.     methocarbamol (ROBAXIN) 500 MG tablet Take 1 tablet (500 mg total) by mouth 2 (two) times daily. 20 tablet 0   predniSONE (DELTASONE) 20 MG tablet Take 1 tablet (20 mg total) by mouth daily. 5 tablet 0   valsartan-hydrochlorothiazide (DIOVAN-HCT) 160-12.5 MG tablet Take 1 tablet by mouth daily. 90 tablet 3   No current facility-administered medications for this visit.    Allergies:   Patient has no known allergies.    Social History:  The patient  reports that he has been smoking cigarettes. He has been smoking an average of 1 pack per day. He has never used smokeless tobacco. He reports current  alcohol use of about 4.0 standard drinks of alcohol per week. He reports that he does not use drugs.   Family History:  The patient's family history includes Cancer in his mother; Diabetes in his brother; Heart failure in his father; Hypertension in his brother, father, and mother.    ROS:  Please see the history of present illness.   Otherwise, review of systems are positive for right leg pain.   All other systems are reviewed and negative.    PHYSICAL EXAM: VS:  BP (!) 142/62   Pulse 66   Ht _0  (1.803 m)   Wt 184 lb 12.8 oz (83.8 kg)   SpO2 97%   BMI 25.77 kg/m  , BMI Body mass index is 25.77 kg/m. GEN: Well nourished, well developed, in no acute distress HEENT: normal Neck: no JVD, carotid bruits, or masses Cardiac: RRR; no murmurs, rubs, or gallops,no edema  Respiratory:  clear to auscultation bilaterally, normal work of breathing GI: soft, nontender, nondistended, + BS MS: no deformity or atrophy Skin: warm and dry, no rash Neuro:  Strength and sensation are intact Psych: euthymic mood, full affect   EKG:   The ekg ordered today demonstrates NSR, LVH   Recent Labs: 12/08/2021: ALT 79 01/17/2022: BUN 18; Creatinine, Ser 1.10; Hemoglobin 20.7; Magnesium 2.0; Platelets 154; Potassium 3.6; Sodium 137   Lipid Panel    Component Value Date/Time   CHOL 129 12/08/2021 1128   CHOL 142 05/28/2019 1511   TRIG 49.0 12/08/2021 1128   HDL 62.60 12/08/2021 1128   HDL 50 05/28/2019 1511   CHOLHDL 2 12/08/2021 1128   VLDL 9.8 12/08/2021 1128   LDLCALC 56 12/08/2021 1128   LDLCALC 79 05/28/2019 1511     Other studies Reviewed: Additional studies/ records that were reviewed today with results demonstrating: ER records reviewed.  Borderline troponin at ER visit.     ASSESSMENT AND PLAN:  HTN: Continue current blood pressure medicine ideally would like to see blood pressure below 140/90 at a minimum, ideally even lower at < 130/80 given Aortic insufficiency and LVH.  Try  to decrease processed food intake.  That will help reduce salt intake and should help lower blood pressure.  Will have follow-up in the Pharm.D. hypertension clinic. LVH: Continue ARB and efforts to lower blood pressure. Aortic insufficiency: Will check echocardiogram.  No CHF. Hyperlipidemia: .Total cholesterol 129 HDL 62 LDL 56 triglycerides 49 well-controlled.  Continue atorvastatin. No ischemic w/u planned for now.  If LVEF is low, would have to consider cath.  Gray zone troponin- likely has some component of chronic diastolic heart failure given LVH.     Current medicines are  reviewed at length with the patient today.  The patient concerns regarding his medicines were addressed.  The following changes have been made:  No change  Labs/ tests ordered today include: echo No orders of the defined types were placed in this encounter.   Recommend 150 minutes/week of aerobic exercise Low fat, low carb, high fiber diet recommended  Disposition:   FU in 3-4 weeks with PharmD   Signed, Larae Grooms, MD  02/23/2022 9:45 AM    [address] [place], [place], [place]  [address] Phone: [phone]; Fax: [phone]

## 2022-02-27 NOTE — Addendum Note (Signed)
Addended by: Janan Halter F on: 02/27/2022 09:10 AM   Modules accepted: Orders

## 2022-03-22 ENCOUNTER — Other Ambulatory Visit (HOSPITAL_COMMUNITY): Payer: PRIVATE HEALTH INSURANCE

## 2022-03-30 ENCOUNTER — Ambulatory Visit: Payer: PRIVATE HEALTH INSURANCE | Attending: Cardiology | Admitting: Pharmacist

## 2022-03-30 VITALS — BP 130/70

## 2022-03-30 DIAGNOSIS — F17200 Nicotine dependence, unspecified, uncomplicated: Secondary | ICD-10-CM

## 2022-03-30 DIAGNOSIS — I10 Essential (primary) hypertension: Secondary | ICD-10-CM | POA: Diagnosis not present

## 2022-03-30 MED ORDER — NICOTINE 21 MG/24HR TD PT24: 21.0000 mg | MEDICATED_PATCH | Freq: Every day | TRANSDERMAL | 0 refills | Status: DC

## 2022-03-30 MED ORDER — NICOTINE 14 MG/24HR TD PT24: 14.0000 mg | MEDICATED_PATCH | Freq: Every day | TRANSDERMAL | 0 refills | Status: DC

## 2022-03-30 MED ORDER — NICOTINE 7 MG/24HR TD PT24: 7.0000 mg | MEDICATED_PATCH | Freq: Every day | TRANSDERMAL | 0 refills | Status: DC

## 2022-03-30 MED ORDER — NICOTINE POLACRILEX 4 MG MT GUM: 4.0000 mg | CHEWING_GUM | OROMUCOSAL | 1 refills | Status: DC | PRN

## 2022-03-30 NOTE — Assessment & Plan Note (Addendum)
Assessment: Blood pressure in clinic today is at goal No home readings available, previous clinic visits have been above goal He is agreeable to getting a blood pressure cuff and checking at home Has cut back caffeine Drinks about 4 beers a day not really interested in cutting back Is interested in quitting smoking, has tried in the past but really only has ever quit for a few days when he was in the hospital Discussed the effects on high blood pressure on his other organs and risk of heart attack, stroke heart failure, kidney failure, PAD  Plan: No medication changes today, I have asked the patient to get a home blood pressure cuff and start checking 1-2 times per day Follow-up in 3 weeks, patient is to bring blood pressure readings and cuff with them for verification Continue amlodipine 10 mg daily and valsartan/HCTZ 160/12.5 mg daily Recommended that he cut back to 2 beers per day Follow-up in clinic in 3 weeks

## 2022-03-30 NOTE — Assessment & Plan Note (Signed)
Assessment: Patient currently smoking 1 pack/day Reports being interested in quitting Has never used anything to help quit in the past Discussed nicotine replacement therapy versus bupropion versus Chantix  Plan: Start nicotine patch 21 mg daily for 6 weeks then decrease to 14 mg daily for 2 weeks then decrease to 7 mg daily for 2 weeks.  Reviewed to place in a clean dry hairless area of the body daily.  May remove prior to bedtime if causes difficulty with sleeping or bad dreams Use nicotine 4 mg gum as needed for cravings.  Reviewed that he is to chew until it tingles then park may chew 1 more time after it stops tingling and park.  1 piece every 1-2 hours, max 24/day.  Suggested 9/day in the beginning Reminded patient that if he smokes with the patch on he will feel very sick Rx for patches and gum sent to pharmacy to see if his insurance would cover Will follow-up on his progress at next visit in 3 weeks

## 2022-03-30 NOTE — Progress Notes (Signed)
Patient ID: Eric Nielsen                 DOB: 1960-10-23                      MRN: [id number]      HPI: Yohannes Waibel is a 62 y.o. male referred by Dr. Irish Lack to HTN clinic. PMH is significant for HTN, back pain, alcohol use and tobacco use, LVH GIIDD and Aortic insufficiency.  Was seen by Dr. Irish Lack on 02/23/2022 blood pressure was 142/62.  Patient was referred to hypertension clinic.  He presents today accompanied by his wife.  He reports compliance with his medications does not miss any doses.  Denies dizziness lightheadedness, chest pain, shortness of breath or swelling.  Does not check his blood pressure at home.  Does not have a blood pressure cuff.  He smokes 1 pack of cigarettes per day, is interested in quitting.  Drinks about 4 beers nightly, not very interested in cutting back.  He walks his dog daily and his job is physical at [employer].  He has cut back on soda and is no longer drinking Pepsi but instead drinking caffeine free crush.   Current HTN meds: amlodipine '10mg'$  daily, valsartan/HCTZ 160/12.'5mg'$  daily Previously tried: lisinopril, olmesartan BP goal: <130/80  Family History:  Family History  Problem Relation Age of Onset   Hypertension Mother    Cancer Mother        Lung and breast and brain   Heart failure Father    Hypertension Father    Hypertension Brother    Diabetes Brother    Colon cancer Neg Hx     Social History: 1 pack per day, 4 beers per day  Diet: Cut back on soda, switch from Pepsi to caffeine free crush  Exercise: walks the dog for 10 to 15 minutes, physical job stocking shelves at [employer] BP readings: None available  Wt Readings from Last 3 Encounters:  02/23/22 184 lb 12.8 oz (83.8 kg)  01/17/22 185 lb (83.9 kg)  01/05/22 184 lb 8 oz (83.7 kg)   BP Readings from Last 3 Encounters:  03/30/22 130/70  02/23/22 (!) 142/62  01/18/22 (!) 152/80   Pulse Readings from Last 3 Encounters:  02/23/22 66  01/18/22 88   01/17/22 97    Renal function: CrCl cannot be calculated (Patient's most recent lab result is older than the maximum 21 days allowed.).  Past Medical History:  Diagnosis Date   Abscess, perirectal    Alcohol use    Aortic atherosclerosis (HCC)    Arthritis    Atherosclerosis of coronary artery    Centrilobular emphysema (HCC)    Chronic back pain    Current smoker    External hemorrhoid    HTN (hypertension)    Hyperlipidemia    Hypertension    Hypertensive left ventricular hypertrophy    Low back pain    Thrombocytopenia (HCC)     Current Outpatient Medications on File Prior to Visit  Medication Sig Dispense Refill   amLODipine (NORVASC) 10 MG tablet Take 1 tablet (10 mg total) by mouth daily. 30 tablet 3   atorvastatin (LIPITOR) 40 MG tablet Take 1 tablet (40 mg total) by mouth daily. 90 tablet 3   Blood Pressure Monitoring (BLOOD PRESSURE KIT) DEVI 1 kit by Does not apply route 2 (two) times daily. 1 each 0   diclofenac Sodium (VOLTAREN) 1 % GEL Apply 2 g  topically 4 (four) times daily. 100 g 1   gabapentin (NEURONTIN) 300 MG capsule Take 300 mg by mouth 3 (three) times daily.     methocarbamol (ROBAXIN) 500 MG tablet Take 1 tablet (500 mg total) by mouth 2 (two) times daily. 20 tablet 0   predniSONE (DELTASONE) 20 MG tablet Take 1 tablet (20 mg total) by mouth daily. 5 tablet 0   valsartan-hydrochlorothiazide (DIOVAN-HCT) 160-12.5 MG tablet Take 1 tablet by mouth daily. 90 tablet 3   No current facility-administered medications on file prior to visit.    No Known Allergies  Blood pressure 130/70.   Assessment/Plan:  1. Hypertension -  Essential hypertension Assessment: Blood pressure in clinic today is at goal No home readings available, previous clinic visits have been above goal He is agreeable to getting a blood pressure cuff and checking at home Has cut back caffeine Drinks about 4 beers a day not really interested in cutting back Is interested in  quitting smoking, has tried in the past but really only has ever quit for a few days when he was in the hospital Discussed the effects on high blood pressure on his other organs and risk of heart attack, stroke heart failure, kidney failure, PAD  Plan: No medication changes today, I have asked the patient to get a home blood pressure cuff and start checking 1-2 times per day Follow-up in 3 weeks, patient is to bring blood pressure readings and cuff with them for verification Continue amlodipine 10 mg daily and valsartan/HCTZ 160/12.5 mg daily Recommended that he cut back to 2 beers per day Follow-up in clinic in 3 weeks  Tobacco use disorder Assessment: Patient currently smoking 1 pack/day Reports being interested in quitting Has never used anything to help quit in the past Discussed nicotine replacement therapy versus bupropion versus Chantix  Plan: Start nicotine patch 21 mg daily for 6 weeks then decrease to 14 mg daily for 2 weeks then decrease to 7 mg daily for 2 weeks.  Reviewed to place in a clean dry hairless area of the body daily.  May remove prior to bedtime if causes difficulty with sleeping or bad dreams Use nicotine 4 mg gum as needed for cravings.  Reviewed that he is to chew until it tingles then park may chew 1 more time after it stops tingling and park.  1 piece every 1-2 hours, max 24/day.  Suggested 9/day in the beginning Reminded patient that if he smokes with the patch on he will feel very sick Rx for patches and gum sent to pharmacy to see if his insurance would cover Will follow-up on his progress at next visit in 3 weeks    Thank you  Ramond Dial, Pharm.D, BCPS, CPP [facility] of [facility] [place] [address], [place], [place] [address]  Phone: [phone]; Fax: [phone]

## 2022-03-30 NOTE — Patient Instructions (Addendum)
Summary of today's discussion  1.Continue amlodipine '10mg'$  daily, valsartan 160/12.'5mg'$  daily  2.Start checking blood pressure at home. Please bring your cuff and readings to next visit  3. Try to decrease beer to 2 per day  4. Nicotine '21mg'$  patch daily for 6 weeks then '14mg'$  patch daily for 2 weeks then '7mg'$  patch for 2 weeks  5.Chew nicotine gum until it tingles then park it in your gum. After it stops tingling chew 1 more time and then park. Can have 1 piece every 1-2 hours. Max 24 per day   Your blood pressure goal is <130/80  To check your pressure at home you will need to:  1. Sit up in a chair, with feet flat on the floor and back supported. Do not cross your ankles or legs. 2. Rest your left arm so that the cuff is about heart level. If the cuff goes on your upper arm,  then just relax the arm on the table, arm of the chair or your lap. If you have a wrist cuff, we  suggest relaxing your wrist against your chest (think of it as Pledging the Flag with the  wrong arm).  3. Place the cuff snugly around your arm, about 1 inch above the crook of your elbow. The  cords should be inside the groove of your elbow.  4. Sit quietly, with the cuff in place, for about 5 minutes. After that 5 minutes press the power  button to start a reading. 5. Do not talk or move while the reading is taking place.  6. Record your readings on a sheet of paper. Although most cuffs have a memory, it is often  easier to see a pattern developing when the numbers are all in front of you.  7. You can repeat the reading after 1-3 minutes if it is recommended  Make sure your bladder is empty and you have not had caffeine or tobacco within the last 30 min  Always bring your blood pressure log with you to your appointments. If you have not brought your monitor in to be double checked for accuracy, please bring it to your next appointment.  You can find a list of validated (accurate) blood pressure cuffs at  [employer].it   Important lifestyle changes to control high blood pressure  Intervention  Effect on the BP  Lose extra pounds and watch your waistline Weight loss is one of the most effective lifestyle changes for controlling blood pressure. If you're overweight or obese, losing even a small amount of weight can help reduce blood pressure. Blood pressure might go down by about 1 millimeter of mercury (mm Hg) with each kilogram (about 2.2 pounds) of weight lost.  Exercise regularly As a general goal, aim for at least 30 minutes of moderate physical activity every day. Regular physical activity can lower high blood pressure by about 5 to 8 mm Hg.  Eat a healthy diet Eating a diet rich in whole grains, fruits, vegetables, and low-fat dairy products and low in saturated fat and cholesterol. A healthy diet can lower high blood pressure by up to 11 mm Hg.  Reduce salt (sodium) in your diet Even a small reduction of sodium in the diet can improve heart health and reduce high blood pressure by about 5 to 6 mm Hg.  Limit alcohol One drink equals 12 ounces of beer, 5 ounces of wine, or 1.5 ounces of 80-proof liquor.  Limiting alcohol to less than one drink a day for women  or two drinks a day for men can help lower blood pressure by about 4 mm Hg.   Please call me at [phone] with any questions.

## 2022-04-13 ENCOUNTER — Ambulatory Visit (HOSPITAL_COMMUNITY): Payer: 59

## 2022-04-20 ENCOUNTER — Ambulatory Visit: Payer: 59

## 2022-05-08 ENCOUNTER — Ambulatory Visit (HOSPITAL_COMMUNITY): Payer: 59 | Attending: Interventional Cardiology

## 2022-05-08 DIAGNOSIS — I351 Nonrheumatic aortic (valve) insufficiency: Secondary | ICD-10-CM | POA: Diagnosis present

## 2022-05-08 LAB — ECHOCARDIOGRAM COMPLETE
AR max vel: 3.25 cm2
AV Area VTI: 3.22 cm2
AV Area mean vel: 2.98 cm2
AV Mean grad: 7.5 mmHg
AV Peak grad: 14.5 mmHg
Ao pk vel: 1.91 m/s
Area-P 1/2: 4.19 cm2
P 1/2 time: 543 msec
S' Lateral: 2.6 cm

## 2022-05-31 ENCOUNTER — Ambulatory Visit: Payer: 59

## 2022-05-31 NOTE — Progress Notes (Incomplete)
Patient ID: Eric Nielsen                 DOB: 1960/09/27                      MRN: [id number]      HPI: Eric Nielsen is a 62 y.o. male referred by Dr. Irish Lack to HTN clinic. PMH is significant for HTN, back pain, alcohol use and tobacco use, LVH GIIDD and Aortic insufficiency.  Was seen by Dr. Irish Lack on 02/23/2022 blood pressure was 142/62.  Patient was referred to hypertension clinic.  In visit 03/30/2022 patient was accompanied by his wife. Reported not checking his blood pressure at home. Did not have a blood pressure cuff.  Reported smoking 1 pack of cigarettes per day, and was interested in quitting. He was not interesting in cutting back alcohol (4 beers nightly). Patient was agreeable to getting a BP cuff and checking at home.  Started nicotine patch and gum.   Current HTN meds: amlodipine '10mg'$  daily, valsartan/HCTZ 160/12.'5mg'$  daily Previously tried: lisinopril, olmesartan BP goal: <130/80  Family History:  Family History  Problem Relation Age of Onset   Hypertension Mother    Cancer Mother        Lung and breast and brain   Heart failure Father    Hypertension Father    Hypertension Brother    Diabetes Brother    Colon cancer Neg Hx     Social History: 1 pack per day, 4 beers per day  Diet: Cut back on soda, switch from Pepsi to caffeine free crush  Exercise: walks the dog for 10 to 15 minutes, physical job stocking shelves at [employer] BP readings: None available  Wt Readings from Last 3 Encounters:  02/23/22 184 lb 12.8 oz (83.8 kg)  01/17/22 185 lb (83.9 kg)  01/05/22 184 lb 8 oz (83.7 kg)   BP Readings from Last 3 Encounters:  03/30/22 130/70  02/23/22 (!) 142/62  01/18/22 (!) 152/80   Pulse Readings from Last 3 Encounters:  02/23/22 66  01/18/22 88  01/17/22 97    Renal function: CrCl cannot be calculated (Patient's most recent lab result is older than the maximum 21 days allowed.).  Past Medical History:  Diagnosis Date   Abscess,  perirectal    Alcohol use    Aortic atherosclerosis (HCC)    Arthritis    Atherosclerosis of coronary artery    Centrilobular emphysema (HCC)    Chronic back pain    Current smoker    External hemorrhoid    HTN (hypertension)    Hyperlipidemia    Hypertension    Hypertensive left ventricular hypertrophy    Low back pain    Thrombocytopenia (HCC)     Current Outpatient Medications on File Prior to Visit  Medication Sig Dispense Refill   amLODipine (NORVASC) 10 MG tablet Take 1 tablet (10 mg total) by mouth daily. 30 tablet 3   atorvastatin (LIPITOR) 40 MG tablet Take 1 tablet (40 mg total) by mouth daily. 90 tablet 3   Blood Pressure Monitoring (BLOOD PRESSURE KIT) DEVI 1 kit by Does not apply route 2 (two) times daily. 1 each 0   diclofenac Sodium (VOLTAREN) 1 % GEL Apply 2 g topically 4 (four) times daily. 100 g 1   gabapentin (NEURONTIN) 300 MG capsule Take 300 mg by mouth 3 (three) times daily.     methocarbamol (ROBAXIN) 500 MG tablet Take 1 tablet (500 mg total) by  mouth 2 (two) times daily. 20 tablet 0   nicotine ([facility] NICOTINE) 7 mg/24hr patch Place 1 patch (7 mg total) onto the skin daily. 14 patch 0   nicotine (NICOTINE STEP 1) 21 mg/24hr patch Place 1 patch (21 mg total) onto the skin daily. 42 patch 0   nicotine (NICOTINE STEP 2) 14 mg/24hr patch Place 1 patch (14 mg total) onto the skin daily. 14 patch 0   nicotine polacrilex (NICORETTE) 4 MG gum Take 1 each (4 mg total) by mouth as needed for smoking cessation. 100 tablet 1   predniSONE (DELTASONE) 20 MG tablet Take 1 tablet (20 mg total) by mouth daily. 5 tablet 0   valsartan-hydrochlorothiazide (DIOVAN-HCT) 160-12.5 MG tablet Take 1 tablet by mouth daily. 90 tablet 3   No current facility-administered medications on file prior to visit.    No Known Allergies  There were no vitals taken for this visit.   Assessment/Plan:  1. Hypertension -  No problem-specific Assessment & Plan notes found for this  encounter.    Thank you  Ramond Dial, Pharm.D, BCPS, CPP [facility] A Division of [facility] [address], [place], [place] [address]  Phone: [phone]; Fax: [phone]

## 2022-07-10 ENCOUNTER — Ambulatory Visit: Payer: PRIVATE HEALTH INSURANCE | Attending: Cardiology | Admitting: Pharmacist

## 2022-07-10 VITALS — BP 136/68 | HR 64

## 2022-07-10 DIAGNOSIS — F17200 Nicotine dependence, unspecified, uncomplicated: Secondary | ICD-10-CM | POA: Diagnosis not present

## 2022-07-10 DIAGNOSIS — I10 Essential (primary) hypertension: Secondary | ICD-10-CM

## 2022-07-10 MED ORDER — VALSARTAN-HYDROCHLOROTHIAZIDE 320-12.5 MG PO TABS: 1.0000 | ORAL_TABLET | Freq: Every day | ORAL | 3 refills | Status: DC

## 2022-07-10 NOTE — Patient Instructions (Signed)
Summary of today's discussion  1.Start checking valsartan/HCTZ 320/12.5mg  daily  2.Continue amlodipine  daily  3.Start checking blood pressure at home once a day  4.Please bring your readings with you to your next visit  5.   Your blood pressure goal is <130/80  To check your pressure at home you will need to:  1. Sit up in a chair, with feet flat on the floor and back supported. Do not cross your ankles or legs. 2. Rest your left arm so that the cuff is about heart level. If the cuff goes on your upper arm,  then just relax the arm on the table, arm of the chair or your lap. If you have a wrist cuff, we  suggest relaxing your wrist against your chest (think of it as Pledging the Flag with the  wrong arm).  3. Place the cuff snugly around your arm, about 1 inch above the crook of your elbow. The  cords should be inside the groove of your elbow.  4. Sit quietly, with the cuff in place, for about 5 minutes. After that 5 minutes press the power  button to start a reading. 5. Do not talk or move while the reading is taking place.  6. Record your readings on a sheet of paper. Although most cuffs have a memory, it is often  easier to see a pattern developing when the numbers are all in front of you.  7. You can repeat the reading after 1-3 minutes if it is recommended  Make sure your bladder is empty and you have not had caffeine or tobacco within the last 30 min  Always bring your blood pressure log with you to your appointments. If you have not brought your monitor in to be double checked for accuracy, please bring it to your next appointment.  You can find a list of validated (accurate) blood pressure cuffs at [employer].no   Important lifestyle changes to control high blood pressure  Intervention  Effect on the BP  Lose extra pounds and watch your waistline Weight loss is one of the most effective lifestyle changes for controlling blood pressure. If you're overweight or obese,  losing even a small amount of weight can help reduce blood pressure. Blood pressure might go down by about 1 millimeter of mercury (mm Hg) with each kilogram (about 2.2 pounds) of weight lost.  Exercise regularly As a general goal, aim for at least 30 minutes of moderate physical activity every day. Regular physical activity can lower high blood pressure by about 5 to 8 mm Hg.  Eat a healthy diet Eating a diet rich in whole grains, fruits, vegetables, and low-fat dairy products and low in saturated fat and cholesterol. A healthy diet can lower high blood pressure by up to 11 mm Hg.  Reduce salt (sodium) in your diet Even a small reduction of sodium in the diet can improve heart health and reduce high blood pressure by about 5 to 6 mm Hg.  Limit alcohol One drink equals 12 ounces of beer, 5 ounces of wine, or 1.5 ounces of 80-proof liquor.  Limiting alcohol to less than one drink a day for women or two drinks a day for men can help lower blood pressure by about 4 mm Hg.   Please call me at [phone] with any questions.

## 2022-07-10 NOTE — Assessment & Plan Note (Signed)
At last visit patient was provided nicotine patches and gum.  Patient reports not being ready to quit.  However he has on his own cut back to half a pack per day.  Will address smoking cessation again at next visit to see if patient is ready to quit.

## 2022-07-10 NOTE — Assessment & Plan Note (Signed)
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  Patient will need BMP at this visit

## 2022-07-10 NOTE — Progress Notes (Signed)
Patient ID: Eric Nielsen                 DOB: January 16, 1961                      MRN: [id number]      HPI: Eric Nielsen is a 62 y.o. male referred by Dr. Eldridge Dace to HTN clinic. PMH is significant for HTN, back pain, alcohol use and tobacco use, LVH GIIDD and Aortic insufficiency.  Was seen by Dr. Eldridge Dace on 02/23/2022 blood pressure was 142/62.  Patient was referred to hypertension clinic.  In visit 03/30/2022 patient was accompanied by his wife. Reported not checking his blood pressure at home. Did not have a blood pressure cuff.  Reported smoking 1 pack of cigarettes per day, and was interested in quitting. He was not interesting in cutting back alcohol (4 beers nightly). Patient was agreeable to getting a BP cuff and checking at home. He was given rx for nicotine patch and gum.   Patient presents today accompanied by his wife.  He has not been checking his blood pressure at home.  He reports he checked his blood pressure at [facility] yesterday and it was in the 150s.  Wife states that she does have a home blood pressure cuff, patient was unaware.  Patient has not started the nicotine patch or gum.  States he is not ready to quit, however, he has cut back to about a half a pack a day from 1 pack/day at last visit.  Last cigarette 11 AM this morning  Patient denies dizziness, lightheadedness, headache, blurred vision, SOB or swelling.   Current HTN meds: amlodipine  daily, valsartan/HCTZ 160/12.5mg  daily Previously tried: lisinopril, olmesartan BP goal: <130/80  Family History:  Family History  Problem Relation Age of Onset   Hypertension Mother    Cancer Mother        Lung and breast and brain   Heart failure Father    Hypertension Father    Hypertension Brother    Diabetes Brother    Colon cancer Neg Hx     Social History: 1/2 pack per day, 4 beers per day  Diet: Cut back on soda, switch from Pepsi to caffeine free crush  Exercise: walks the dog for 10 to 15 minutes, physical  job stocking shelves at [employer] BP readings: None available  Wt Readings from Last 3 Encounters:  02/23/22 184 lb 12.8 oz (83.8 kg)  01/17/22 185 lb (83.9 kg)  01/05/22 184 lb 8 oz (83.7 kg)   BP Readings from Last 3 Encounters:  07/10/22 136/68  03/30/22 130/70  02/23/22 (!) 142/62   Pulse Readings from Last 3 Encounters:  07/10/22 64  02/23/22 66  01/18/22 88    Renal function: CrCl cannot be calculated (Patient's most recent lab result is older than the maximum 21 days allowed.).  Past Medical History:  Diagnosis Date   Abscess, perirectal    Alcohol use    Aortic atherosclerosis (HCC)    Arthritis    Atherosclerosis of coronary artery    Centrilobular emphysema (HCC)    Chronic back pain    Current smoker    External hemorrhoid    HTN (hypertension)    Hyperlipidemia    Hypertension    Hypertensive left ventricular hypertrophy    Low back pain    Thrombocytopenia (HCC)     Current Outpatient Medications on File Prior to Visit  Medication Sig Dispense Refill   amLODipine (  NORVASC) 10 MG tablet Take 1 tablet (10 mg total) by mouth daily. 30 tablet 3   atorvastatin (LIPITOR) 40 MG tablet Take 1 tablet (40 mg total) by mouth daily. 90 tablet 3   Blood Pressure Monitoring (BLOOD PRESSURE KIT) DEVI 1 kit by Does not apply route 2 (two) times daily. 1 each 0   diclofenac Sodium (VOLTAREN) 1 % GEL Apply 2 g topically 4 (four) times daily. 100 g 1   gabapentin (NEURONTIN) 300 MG capsule Take 300 mg by mouth 3 (three) times daily.     methocarbamol (ROBAXIN) 500 MG tablet Take 1 tablet (500 mg total) by mouth 2 (two) times daily. 20 tablet 0   nicotine ([facility] NICOTINE) 7 mg/24hr patch Place 1 patch (7 mg total) onto the skin daily. 14 patch 0   nicotine (NICOTINE STEP 1) 21 mg/24hr patch Place 1 patch (21 mg total) onto the skin daily. 42 patch 0   nicotine (NICOTINE STEP 2) 14 mg/24hr patch Place 1 patch (14 mg total) onto the skin daily. 14 patch 0   nicotine  polacrilex (NICORETTE) 4 MG gum Take 1 each (4 mg total) by mouth as needed for smoking cessation. 100 tablet 1   predniSONE (DELTASONE) 20 MG tablet Take 1 tablet (20 mg total) by mouth daily. 5 tablet 0   No current facility-administered medications on file prior to visit.    No Known Allergies  Blood pressure 136/68, pulse 64, SpO2 96 %.   Assessment/Plan:  1. Hypertension -  Essential hypertension Assessment: Blood pressure is above goal of less than 130/80 in clinic today No home readings are available other than 1 reading at [facility] yesterday that was high Patient physical activity includes walking the dog and stacking shelves at [employer]  Plan: Increase valsartan/HCTZ to valsartan 320/12.5 mg daily Continue amlodipine 10 mg daily Follow-up in clinic in 3 weeks.  Patient will need BMP at this visit  Tobacco use disorder At last visit patient was provided nicotine patches and gum.  Patient reports not being ready to quit.  However he has on his own cut back to half a pack per day.  Will address smoking cessation again at next visit to see if patient is ready to quit.    Thank you  Olene Floss, Pharm.D, BCPS, CPP [facility] Division of [facility] [address] [place], [place] [address]  Phone: [phone]; Fax: [phone]

## 2022-07-20 ENCOUNTER — Ambulatory Visit: Payer: 59 | Admitting: Emergency Medicine

## 2022-07-31 ENCOUNTER — Encounter: Payer: Self-pay | Admitting: Emergency Medicine

## 2022-07-31 ENCOUNTER — Ambulatory Visit: Payer: BLUE CROSS/BLUE SHIELD | Admitting: Emergency Medicine

## 2022-07-31 VITALS — BP 130/72 | HR 67 | Temp 98.3°F | Ht 71.0 in | Wt 174.5 lb

## 2022-07-31 DIAGNOSIS — J432 Centrilobular emphysema: Secondary | ICD-10-CM

## 2022-07-31 DIAGNOSIS — Z862 Personal history of diseases of the blood and blood-forming organs and certain disorders involving the immune mechanism: Secondary | ICD-10-CM

## 2022-07-31 DIAGNOSIS — D696 Thrombocytopenia, unspecified: Secondary | ICD-10-CM

## 2022-07-31 DIAGNOSIS — F17200 Nicotine dependence, unspecified, uncomplicated: Secondary | ICD-10-CM

## 2022-07-31 DIAGNOSIS — I10 Essential (primary) hypertension: Secondary | ICD-10-CM

## 2022-07-31 DIAGNOSIS — Z1211 Encounter for screening for malignant neoplasm of colon: Secondary | ICD-10-CM

## 2022-07-31 DIAGNOSIS — E785 Hyperlipidemia, unspecified: Secondary | ICD-10-CM

## 2022-07-31 DIAGNOSIS — I2510 Atherosclerotic heart disease of native coronary artery without angina pectoris: Secondary | ICD-10-CM

## 2022-07-31 DIAGNOSIS — I700 Atherosclerosis of aorta: Secondary | ICD-10-CM

## 2022-07-31 DIAGNOSIS — I119 Hypertensive heart disease without heart failure: Secondary | ICD-10-CM

## 2022-07-31 LAB — COMPREHENSIVE METABOLIC PANEL
ALT: 75 U/L — ABNORMAL HIGH (ref 0–53)
AST: 92 U/L — ABNORMAL HIGH (ref 0–37)
Albumin: 4.1 g/dL (ref 3.5–5.2)
Alkaline Phosphatase: 108 U/L (ref 39–117)
BUN: 19 mg/dL (ref 6–23)
CO2: 30 mEq/L (ref 19–32)
Calcium: 9.8 mg/dL (ref 8.4–10.5)
Chloride: 98 mEq/L (ref 96–112)
Creatinine, Ser: 1.07 mg/dL (ref 0.40–1.50)
GFR: 75.83 mL/min (ref >60.00)
Glucose, Bld: 100 mg/dL — ABNORMAL HIGH (ref 70–99)
Potassium: 4.2 mEq/L (ref 3.5–5.1)
Sodium: 134 mEq/L — ABNORMAL LOW (ref 135–145)
Total Bilirubin: 0.6 mg/dL (ref 0.2–1.2)
Total Protein: 7.9 g/dL (ref 6.0–8.3)

## 2022-07-31 LAB — CBC WITH DIFFERENTIAL/PLATELET
Basophils Absolute: 0 10*3/uL (ref 0.0–0.1)
Basophils Relative: 1 % (ref 0.0–3.0)
Eosinophils Absolute: 0.1 10*3/uL (ref 0.0–0.7)
Eosinophils Relative: 2.2 % (ref 0.0–5.0)
HCT: 46.5 % (ref 39.0–52.0)
Hemoglobin: 16.2 g/dL (ref 13.0–17.0)
Lymphocytes Relative: 41.9 % (ref 12.0–46.0)
Lymphs Abs: 1.5 10*3/uL (ref 0.7–4.0)
MCHC: 34.8 g/dL (ref 30.0–36.0)
MCV: 95.2 fl (ref 78.0–100.0)
Monocytes Absolute: 0.8 10*3/uL (ref 0.1–1.0)
Monocytes Relative: 24 % — ABNORMAL HIGH (ref 3.0–12.0)
Neutro Abs: 1.1 10*3/uL — ABNORMAL LOW (ref 1.4–7.7)
Neutrophils Relative %: 30.9 % — ABNORMAL LOW (ref 43.0–77.0)
Platelets: 112 10*3/uL — ABNORMAL LOW (ref 150.0–400.0)
RBC: 4.89 Mil/uL (ref 4.22–5.81)
RDW: 12.8 % (ref 11.5–15.5)
WBC: 3.5 10*3/uL — ABNORMAL LOW (ref 4.0–10.5)

## 2022-07-31 LAB — LIPID PANEL
Cholesterol: 95 mg/dL (ref 0–200)
HDL: 53.5 mg/dL (ref >39.00)
LDL Cholesterol: 35 mg/dL (ref 0–99)
NonHDL: 41.97
Total CHOL/HDL Ratio: 2
Triglycerides: 34 mg/dL (ref 0.0–149.0)
VLDL: 6.8 mg/dL (ref 0.0–40.0)

## 2022-07-31 LAB — HEMOGLOBIN A1C: Hgb A1c MFr Bld: 5.7 % (ref 4.6–6.5)

## 2022-07-31 NOTE — Assessment & Plan Note (Signed)
Chronic and stable. No clinical bleeding episodes. CBC done today

## 2022-07-31 NOTE — Patient Instructions (Signed)
Hypertension, Adult High blood pressure (hypertension) is when the force of blood pumping through the arteries is too strong. The arteries are the blood vessels that carry blood from the heart throughout the body. Hypertension forces the heart to work harder to pump blood and may cause arteries to become narrow or stiff. Untreated or uncontrolled hypertension can lead to a heart attack, heart failure, a stroke, kidney disease, and other problems. A blood pressure reading consists of a higher number over a lower number. Ideally, your blood pressure should be below 120/80. The first ("top") number is called the systolic pressure. It is a measure of the pressure in your arteries as your heart beats. The second ("bottom") number is called the diastolic pressure. It is a measure of the pressure in your arteries as the heart relaxes. What are the causes? The exact cause of this condition is not known. There are some conditions that result in high blood pressure. What increases the risk? Certain factors may make you more likely to develop high blood pressure. Some of these risk factors are under your control, including: Smoking. Not getting enough exercise or physical activity. Being overweight. Having too much fat, sugar, calories, or salt (sodium) in your diet. Drinking too much alcohol. Other risk factors include: Having a personal history of heart disease, diabetes, high cholesterol, or kidney disease. Stress. Having a family history of high blood pressure and high cholesterol. Having obstructive sleep apnea. Age. The risk increases with age. What are the signs or symptoms? High blood pressure may not cause symptoms. Very high blood pressure (hypertensive crisis) may cause: Headache. Fast or irregular heartbeats (palpitations). Shortness of breath. Nosebleed. Nausea and vomiting. Vision changes. Severe chest pain, dizziness, and seizures. How is this diagnosed? This condition is diagnosed by  measuring your blood pressure while you are seated, with your arm resting on a flat surface, your legs uncrossed, and your feet flat on the floor. The cuff of the blood pressure monitor will be placed directly against the skin of your upper arm at the level of your heart. Blood pressure should be measured at least twice using the same arm. Certain conditions can cause a difference in blood pressure between your right and left arms. If you have a high blood pressure reading during one visit or you have normal blood pressure with other risk factors, you may be asked to: Return on a different day to have your blood pressure checked again. Monitor your blood pressure at home for 1 week or longer. If you are diagnosed with hypertension, you may have other blood or imaging tests to help your health care provider understand your overall risk for other conditions. How is this treated? This condition is treated by making healthy lifestyle changes, such as eating healthy foods, exercising more, and reducing your alcohol intake. You may be referred for counseling on a healthy diet and physical activity. Your health care provider may prescribe medicine if lifestyle changes are not enough to get your blood pressure under control and if: Your systolic blood pressure is above 130. Your diastolic blood pressure is above 80. Your personal target blood pressure may vary depending on your medical conditions, your age, and other factors. Follow these instructions at home: Eating and drinking  Eat a diet that is high in fiber and potassium, and low in sodium, added sugar, and fat. An example of this eating plan is called the DASH diet. DASH stands for Dietary Approaches to Stop Hypertension. To eat this way: Eat   plenty of fresh fruits and vegetables. Try to fill one half of your plate at each meal with fruits and vegetables. Eat whole grains, such as whole-wheat pasta, brown rice, or whole-grain bread. Fill about one  fourth of your plate with whole grains. Eat or drink low-fat dairy products, such as skim milk or low-fat yogurt. Avoid fatty cuts of meat, processed or cured meats, and poultry with skin. Fill about one fourth of your plate with lean proteins, such as fish, chicken without skin, beans, eggs, or tofu. Avoid pre-made and processed foods. These tend to be higher in sodium, added sugar, and fat. Reduce your daily sodium intake. Many people with hypertension should eat less than 1,500 mg of sodium a day. Do not drink alcohol if: Your health care provider tells you not to drink. You are pregnant, may be pregnant, or are planning to become pregnant. If you drink alcohol: Limit how much you have to: 0-1 drink a day for women. 0-2 drinks a day for men. Know how much alcohol is in your drink. In the [place] one drink equals one 12 oz bottle of beer (355 mL), one 5 oz glass of wine (148 mL), or one 1 oz glass of hard liquor (44 mL). Lifestyle  Work with your health care provider to maintain a healthy body weight or to lose weight. Ask what an ideal weight is for you. Get at least 30 minutes of exercise that causes your heart to beat faster (aerobic exercise) most days of the week. Activities may include walking, swimming, or biking. Include exercise to strengthen your muscles (resistance exercise), such as Pilates or lifting weights, as part of your weekly exercise routine. Try to do these types of exercises for 30 minutes at least 3 days a week. Do not use any products that contain nicotine or tobacco. These products include cigarettes, chewing tobacco, and vaping devices, such as e-cigarettes. If you need help quitting, ask your health care provider. Monitor your blood pressure at home as told by your health care provider. Keep all follow-up visits. This is important. Medicines Take over-the-counter and prescription medicines only as told by your health care provider. Follow directions carefully. Blood  pressure medicines must be taken as prescribed. Do not skip doses of blood pressure medicine. Doing this puts you at risk for problems and can make the medicine less effective. Ask your health care provider about side effects or reactions to medicines that you should watch for. Contact a health care provider if you: Think you are having a reaction to a medicine you are taking. Have headaches that keep coming back (recurring). Feel dizzy. Have swelling in your ankles. Have trouble with your vision. Get help right away if you: Develop a severe headache or confusion. Have unusual weakness or numbness. Feel faint. Have severe pain in your chest or abdomen. Vomit repeatedly. Have trouble breathing. These symptoms may be an emergency. Get help right away. Call 911. Do not wait to see if the symptoms will go away. Do not drive yourself to the hospital. Summary Hypertension is when the force of blood pumping through your arteries is too strong. If this condition is not controlled, it may put you at risk for serious complications. Your personal target blood pressure may vary depending on your medical conditions, your age, and other factors. For most people, a normal blood pressure is less than 120/80. Hypertension is treated with lifestyle changes, medicines, or a combination of both. Lifestyle changes include losing weight, eating a healthy,   low-sodium diet, exercising more, and limiting alcohol. This information is not intended to replace advice given to you by your health care provider. Make sure you discuss any questions you have with your health care provider. Document Revised: 01/11/2021 Document Reviewed: 01/11/2021 Elsevier Patient Education  2023 [employer]  

## 2022-07-31 NOTE — Assessment & Plan Note (Signed)
Most likely secondary to smoking. CBC done today. 

## 2022-07-31 NOTE — Assessment & Plan Note (Signed)
Diet and nutrition discussed Lipid profile done today Continues atorvastatin 40 mg daily

## 2022-07-31 NOTE — Assessment & Plan Note (Signed)
No signs of congestive heart failure

## 2022-07-31 NOTE — Assessment & Plan Note (Signed)
Cardiovascular and cancer risks associated with smoking discussed. °Smoking cessation advice given. °

## 2022-07-31 NOTE — Assessment & Plan Note (Signed)
Diet and nutrition discussed. Lipid profile done today. Continue atorvastatin 40 mg daily. 

## 2022-07-31 NOTE — Progress Notes (Signed)
Eric Nielsen 62 y.o.   Chief Complaint  Patient presents with   Medical Management of Chronic Issues    F/u appt, no concerns     HISTORY OF PRESENT ILLNESS: This is a 62 y.o. male here for follow-up of chronic medical problems including hypertension and COPD Still smoking. Has no complaints or medical concerns today.  HPI   Prior to Admission medications   Medication Sig Start Date End Date Taking? Authorizing Provider  amLODipine (NORVASC) 10 MG tablet Take 1 tablet (10 mg total) by mouth daily. 12/08/21  Yes Anielle Headrick, Eilleen Kempf, MD  atorvastatin (LIPITOR) 40 MG tablet Take 1 tablet (40 mg total) by mouth daily. 12/08/21  Yes Takoda Siedlecki, Eilleen Kempf, MD  Blood Pressure Monitoring (BLOOD PRESSURE KIT) DEVI 1 kit by Does not apply route 2 (two) times daily. 06/02/19  Yes Peggyann Shoals C, DO  diclofenac Sodium (VOLTAREN) 1 % GEL Apply 2 g topically 4 (four) times daily. 09/24/19  Yes Peggyann Shoals C, DO  gabapentin (NEURONTIN) 300 MG capsule Take 300 mg by mouth 3 (three) times daily. 01/30/22  Yes [provider]  valsartan-hydrochlorothiazide (DIOVAN-HCT) 320-12.5 MG tablet Take 1 tablet by mouth daily. 07/10/22  Yes Corky Crafts, MD  methocarbamol (ROBAXIN) 500 MG tablet Take 1 tablet (500 mg total) by mouth 2 (two) times daily. Patient not taking: Reported on 07/31/2022 01/17/22   Debby Freiberg, NP  nicotine ([facility] NICOTINE) 7 mg/24hr patch Place 1 patch (7 mg total) onto the skin daily. Patient not taking: Reported on 07/31/2022 03/30/22   Corky Crafts, MD  nicotine (NICOTINE STEP 1) 21 mg/24hr patch Place 1 patch (21 mg total) onto the skin daily. Patient not taking: Reported on 07/31/2022 03/30/22   Corky Crafts, MD  nicotine (NICOTINE STEP 2) 14 mg/24hr patch Place 1 patch (14 mg total) onto the skin daily. Patient not taking: Reported on 07/31/2022 03/30/22   Corky Crafts, MD  nicotine polacrilex (NICORETTE) 4 MG gum Take 1 each (4 mg  total) by mouth as needed for smoking cessation. Patient not taking: Reported on 07/31/2022 03/30/22   Corky Crafts, MD    No Known Allergies  Patient Active Problem List   Diagnosis Date Noted   Uncontrolled hypertension 12/08/2021   Hypertensive left ventricular hypertrophy, without heart failure 12/08/2021   Atherosclerosis of native coronary artery of native heart without angina pectoris 12/08/2021   Centrilobular emphysema (HCC) 12/08/2021   Atherosclerosis of aorta (HCC) 12/08/2021   Thrombocytopenia (HCC) 06/04/2019   Systolic murmur 05/28/2019   Dyslipidemia 05/28/2019   History of polycythemia 06/13/2017   Alcohol use 06/13/2017   Chronic bilateral low back pain without sciatica 11/02/2015   Tobacco use disorder 01/15/2015   Essential hypertension 12/24/2013   Abnormal EKG 04/18/2013    Past Medical History:  Diagnosis Date   Abscess, perirectal    Alcohol use    Aortic atherosclerosis (HCC)    Arthritis    Atherosclerosis of coronary artery    Centrilobular emphysema (HCC)    Chronic back pain    Current smoker    External hemorrhoid    HTN (hypertension)    Hyperlipidemia    Hypertension    Hypertensive left ventricular hypertrophy    Low back pain    Thrombocytopenia (HCC)     Past Surgical History:  Procedure Laterality Date   INCISION AND DRAINAGE PERIRECTAL ABSCESS N/A 10/14/2012   Procedure: IRRIGATION AND DEBRIDEMENT PERIRECTAL ABSCESS;  Surgeon: Liz Malady, MD;  Location: [facility] OR;  Service: General;  Laterality: N/A;   INCISION AND DRAINAGE PERIRECTAL ABSCESS N/A 01/23/2015   Procedure: EXAM UNDER ANESTHESIA;  Surgeon: Harriette Bouillon, MD;  Location: [facility] OR;  Service: General;  Laterality: N/A;    Social History   Socioeconomic History   Marital status: Married    Spouse name: Not on file   Number of children: Not on file   Years of education: Not on file   Highest education level: Not on file  Occupational History   Not on file   Tobacco Use   Smoking status: Heavy Smoker    Packs/day: 1    Types: Cigarettes   Smokeless tobacco: Never  Vaping Use   Vaping Use: Never used  Substance and Sexual Activity   Alcohol use: Yes    Alcohol/week: 4.0 standard drinks of alcohol    Types: 4 Cans of beer per week   Drug use: Never   Sexual activity: Yes    Partners: Female  Other Topics Concern   Not on file  Social History Narrative   ** Merged History Encounter **       Social Determinants of Health   Financial Resource Strain: Not on file  Food Insecurity: Not on file  Transportation Needs: Not on file  Physical Activity: Not on file  Stress: Not on file  Social Connections: Not on file  Intimate Partner Violence: Not on file    Family History  Problem Relation Age of Onset   Hypertension Mother    Cancer Mother        Lung and breast and brain   Heart failure Father    Hypertension Father    Hypertension Brother    Diabetes Brother    Colon cancer Neg Hx      Review of Systems  Constitutional: Negative.  Negative for chills and fever.  HENT: Negative.  Negative for congestion and sore throat.   Respiratory: Negative.  Negative for cough and shortness of breath.   Cardiovascular: Negative.  Negative for chest pain and palpitations.  Gastrointestinal:  Negative for abdominal pain, diarrhea, nausea and vomiting.  Genitourinary: Negative.  Negative for dysuria and hematuria.  Skin: Negative.  Negative for rash.  Neurological: Negative.  Negative for dizziness and headaches.  All other systems reviewed and are negative.   Vitals:   07/31/22 1517  BP: 130/72  Pulse: 67  Temp: 98.3 F (36.8 C)  SpO2: 97%    Physical Exam Vitals reviewed.  Constitutional:      Appearance: Normal appearance.  HENT:     Head: Normocephalic.     Mouth/Throat:     Mouth: Mucous membranes are moist.     Pharynx: Oropharynx is clear.  Eyes:     Extraocular Movements: Extraocular movements intact.      Pupils: Pupils are equal, round, and reactive to light.  Cardiovascular:     Rate and Rhythm: Normal rate and regular rhythm.     Pulses: Normal pulses.     Heart sounds: Normal heart sounds.  Pulmonary:     Effort: Pulmonary effort is normal.     Breath sounds: Normal breath sounds.  Abdominal:     Palpations: Abdomen is soft.     Tenderness: There is no abdominal tenderness.  Musculoskeletal:     Cervical back: No tenderness.  Lymphadenopathy:     Cervical: No cervical adenopathy.  Skin:    General: Skin is warm and dry.     Capillary Refill: Capillary  refill takes less than 2 seconds.  Neurological:     General: No focal deficit present.     Mental Status: He is alert and oriented to person, place, and time.  Psychiatric:        Mood and Affect: Mood normal.        Behavior: Behavior normal.      ASSESSMENT & PLAN: A total of 47 minutes was spent with the patient and counseling/coordination of care regarding preparing for this visit, review of most recent office visit notes, review of multiple chronic medical conditions and their management, review of all medications, review of most recent blood work results, cardiovascular risks associated with hypertension and dyslipidemia, education on nutrition, prognosis, documentation and need for follow-up.  Problem List Items Addressed This Visit       Cardiovascular and Mediastinum   Essential hypertension - Primary    Well-controlled hypertension. Continue Diovan HCT 320-12.5 mg daily along with amlodipine 10 mg daily Cardiovascular risks associated with uncontrolled hypertension discussed Dietary approaches to stop hypertension discussed.      Relevant Orders   CBC with Differential/Platelet   Comprehensive metabolic panel   Hemoglobin A1c   Lipid panel   Hypertensive left ventricular hypertrophy, without heart failure    No signs of congestive heart failure      Atherosclerosis of native coronary artery of native  heart without angina pectoris    No anginal episodes. Continues atorvastatin 40 mg daily Benefits of exercise discussed Diet and nutrition discussed      Atherosclerosis of aorta (HCC)    Diet and nutrition discussed Lipid profile done today Continues atorvastatin 40 mg daily        Respiratory   Centrilobular emphysema (HCC)    Asymptomatic.        Hematopoietic and Hemostatic   Thrombocytopenia (HCC)    Chronic and stable. No clinical bleeding episodes. CBC done today      Relevant Orders   Ambulatory referral to Gastroenterology     Other   Tobacco use disorder    Cardiovascular and cancer risks associated with smoking discussed Smoking cessation advice given      History of polycythemia    Most likely secondary to smoking CBC done today.      Relevant Orders   Ambulatory referral to Gastroenterology   Dyslipidemia    Diet and nutrition discussed Lipid profile done today Continue atorvastatin 40 mg daily.       Relevant Orders   Comprehensive metabolic panel   Hemoglobin A1c   Lipid panel   Other Visit Diagnoses     Screening for colon cancer       Relevant Orders   Ambulatory referral to Gastroenterology      Patient Instructions  Hypertension, Adult High blood pressure (hypertension) is when the force of blood pumping through the arteries is too strong. The arteries are the blood vessels that carry blood from the heart throughout the body. Hypertension forces the heart to work harder to pump blood and may cause arteries to become narrow or stiff. Untreated or uncontrolled hypertension can lead to a heart attack, heart failure, a stroke, kidney disease, and other problems. A blood pressure reading consists of a higher number over a lower number. Ideally, your blood pressure should be below 120/80. The first ("top") number is called the systolic pressure. It is a measure of the pressure in your arteries as your heart beats. The second ("bottom")  number is called the diastolic pressure. It is  a measure of the pressure in your arteries as the heart relaxes. What are the causes? The exact cause of this condition is not known. There are some conditions that result in high blood pressure. What increases the risk? Certain factors may make you more likely to develop high blood pressure. Some of these risk factors are under your control, including: Smoking. Not getting enough exercise or physical activity. Being overweight. Having too much fat, sugar, calories, or salt (sodium) in your diet. Drinking too much alcohol. Other risk factors include: Having a personal history of heart disease, diabetes, high cholesterol, or kidney disease. Stress. Having a family history of high blood pressure and high cholesterol. Having obstructive sleep apnea. Age. The risk increases with age. What are the signs or symptoms? High blood pressure may not cause symptoms. Very high blood pressure (hypertensive crisis) may cause: Headache. Fast or irregular heartbeats (palpitations). Shortness of breath. Nosebleed. Nausea and vomiting. Vision changes. Severe chest pain, dizziness, and seizures. How is this diagnosed? This condition is diagnosed by measuring your blood pressure while you are seated, with your arm resting on a flat surface, your legs uncrossed, and your feet flat on the floor. The cuff of the blood pressure monitor will be placed directly against the skin of your upper arm at the level of your heart. Blood pressure should be measured at least twice using the same arm. Certain conditions can cause a difference in blood pressure between your right and left arms. If you have a high blood pressure reading during one visit or you have normal blood pressure with other risk factors, you may be asked to: Return on a different day to have your blood pressure checked again. Monitor your blood pressure at home for 1 week or longer. If you are diagnosed  with hypertension, you may have other blood or imaging tests to help your health care provider understand your overall risk for other conditions. How is this treated? This condition is treated by making healthy lifestyle changes, such as eating healthy foods, exercising more, and reducing your alcohol intake. You may be referred for counseling on a healthy diet and physical activity. Your health care provider may prescribe medicine if lifestyle changes are not enough to get your blood pressure under control and if: Your systolic blood pressure is above 130. Your diastolic blood pressure is above 80. Your personal target blood pressure may vary depending on your medical conditions, your age, and other factors. Follow these instructions at home: Eating and drinking  Eat a diet that is high in fiber and potassium, and low in sodium, added sugar, and fat. An example of this eating plan is called the DASH diet. DASH stands for Dietary Approaches to Stop Hypertension. To eat this way: Eat plenty of fresh fruits and vegetables. Try to fill one half of your plate at each meal with fruits and vegetables. Eat whole grains, such as whole-wheat pasta, brown rice, or whole-grain bread. Fill about one fourth of your plate with whole grains. Eat or drink low-fat dairy products, such as skim milk or low-fat yogurt. Avoid fatty cuts of meat, processed or cured meats, and poultry with skin. Fill about one fourth of your plate with lean proteins, such as fish, chicken without skin, beans, eggs, or tofu. Avoid pre-made and processed foods. These tend to be higher in sodium, added sugar, and fat. Reduce your daily sodium intake. Many people with hypertension should eat less than 1,500 mg of sodium a day. Do not drink  alcohol if: Your health care provider tells you not to drink. You are pregnant, may be pregnant, or are planning to become pregnant. If you drink alcohol: Limit how much you have to: 0-1 drink a day  for women. 0-2 drinks a day for men. Know how much alcohol is in your drink. In the [place] one drink equals one 12 oz bottle of beer (355 mL), one 5 oz glass of wine (148 mL), or one 1 oz glass of hard liquor (44 mL). Lifestyle  Work with your health care provider to maintain a healthy body weight or to lose weight. Ask what an ideal weight is for you. Get at least 30 minutes of exercise that causes your heart to beat faster (aerobic exercise) most days of the week. Activities may include walking, swimming, or biking. Include exercise to strengthen your muscles (resistance exercise), such as Pilates or lifting weights, as part of your weekly exercise routine. Try to do these types of exercises for 30 minutes at least 3 days a week. Do not use any products that contain nicotine or tobacco. These products include cigarettes, chewing tobacco, and vaping devices, such as e-cigarettes. If you need help quitting, ask your health care provider. Monitor your blood pressure at home as told by your health care provider. Keep all follow-up visits. This is important. Medicines Take over-the-counter and prescription medicines only as told by your health care provider. Follow directions carefully. Blood pressure medicines must be taken as prescribed. Do not skip doses of blood pressure medicine. Doing this puts you at risk for problems and can make the medicine less effective. Ask your health care provider about side effects or reactions to medicines that you should watch for. Contact a health care provider if you: Think you are having a reaction to a medicine you are taking. Have headaches that keep coming back (recurring). Feel dizzy. Have swelling in your ankles. Have trouble with your vision. Get help right away if you: Develop a severe headache or confusion. Have unusual weakness or numbness. Feel faint. Have severe pain in your chest or abdomen. Vomit repeatedly. Have trouble breathing. These  symptoms may be an emergency. Get help right away. Call 911. Do not wait to see if the symptoms will go away. Do not drive yourself to the hospital. Summary Hypertension is when the force of blood pumping through your arteries is too strong. If this condition is not controlled, it may put you at risk for serious complications. Your personal target blood pressure may vary depending on your medical conditions, your age, and other factors. For most people, a normal blood pressure is less than 120/80. Hypertension is treated with lifestyle changes, medicines, or a combination of both. Lifestyle changes include losing weight, eating a healthy, low-sodium diet, exercising more, and limiting alcohol. This information is not intended to replace advice given to you by your health care provider. Make sure you discuss any questions you have with your health care provider. Document Revised: 01/11/2021 Document Reviewed: 01/11/2021 Elsevier Patient Education  2023 Elsevier Inc.    Edwina Barth, MD [facility]

## 2022-07-31 NOTE — Assessment & Plan Note (Signed)
Asymptomatic. 

## 2022-07-31 NOTE — Assessment & Plan Note (Signed)
No anginal episodes. Continues atorvastatin 40 mg daily Benefits of exercise discussed Diet and nutrition discussed

## 2022-07-31 NOTE — Assessment & Plan Note (Signed)
Well-controlled hypertension. Continue Diovan HCT 320-12.5 mg daily along with amlodipine 10 mg daily Cardiovascular risks associated with uncontrolled hypertension discussed Dietary approaches to stop hypertension discussed.

## 2022-08-01 ENCOUNTER — Ambulatory Visit: Payer: BC Managed Care – PPO | Attending: Emergency Medicine

## 2022-08-01 NOTE — Progress Notes (Deleted)
Patient ID: Eric Nielsen                 DOB: 01-15-1961                      MRN: [id number]      HPI: Eric Nielsen is a 62 y.o. male referred by Dr. Eldridge Dace to HTN clinic. PMH is significant for HTN, back pain, alcohol use and tobacco use, LVH GIIDD and Aortic insufficiency.  Was seen by Dr. Eldridge Dace on 02/23/2022 blood pressure was 142/62.  Patient was referred to hypertension clinic.  In visit 03/30/2022 patient was accompanied by his wife. Reported not checking his blood pressure at home. Did not have a blood pressure cuff.  Reported smoking 1 pack of cigarettes per day, and was interested in quitting. He was not interesting in cutting back alcohol (4 beers nightly). Patient was agreeable to getting a BP cuff and checking at home. He was given rx for nicotine patch and gum.   At visit 4/22, valsartan was increased to 320mg  daily. Patient was asked to check blood pressure at home.  Home bp? Dizziness, lightheadedness, headache, blurred vision, SOB, swelling Smoking?  Patient presents today accompanied by his wife.  He has not been checking his blood pressure at home.  He reports he checked his blood pressure at [facility] yesterday and it was in the 150s.  Wife states that she does have a home blood pressure cuff, patient was unaware.  Patient has not started the nicotine patch or gum.  States he is not ready to quit, however, he has cut back to about a half a pack a day from 1 pack/day at last visit.  Last cigarette 11 AM this morning  Patient denies dizziness, lightheadedness, headache, blurred vision, SOB or swelling.   Current HTN meds: amlodipine 10mg  daily, valsartan/HCTZ 320/12.5mg  daily Previously tried: lisinopril, olmesartan BP goal: <130/80  Family History:  Family History  Problem Relation Age of Onset   Hypertension Mother    Cancer Mother        Lung and breast and brain   Heart failure Father    Hypertension Father    Hypertension Brother    Diabetes Brother    Colon  cancer Neg Hx     Social History: 1/2 pack per day, 4 beers per day  Diet: Cut back on soda, switch from Pepsi to caffeine free crush  Exercise: walks the dog for 10 to 15 minutes, physical job stocking shelves at [employer] BP readings: None available  Wt Readings from Last 3 Encounters:  07/31/22 174 lb 8 oz (79.2 kg)  02/23/22 184 lb 12.8 oz (83.8 kg)  01/17/22 185 lb (83.9 kg)   BP Readings from Last 3 Encounters:  07/31/22 130/72  07/10/22 136/68  03/30/22 130/70   Pulse Readings from Last 3 Encounters:  07/31/22 67  07/10/22 64  02/23/22 66    Renal function: Estimated Creatinine Clearance: 79.2 mL/min (by C-G formula based on SCr of 1.07 mg/dL).  Past Medical History:  Diagnosis Date   Abscess, perirectal    Alcohol use    Aortic atherosclerosis (HCC)    Arthritis    Atherosclerosis of coronary artery    Centrilobular emphysema (HCC)    Chronic back pain    Current smoker    External hemorrhoid    HTN (hypertension)    Hyperlipidemia    Hypertension    Hypertensive left ventricular hypertrophy    Low back  pain    Thrombocytopenia (HCC)     Current Outpatient Medications on File Prior to Visit  Medication Sig Dispense Refill   amLODipine (NORVASC) 10 MG tablet Take 1 tablet (10 mg total) by mouth daily. 30 tablet 3   atorvastatin (LIPITOR) 40 MG tablet Take 1 tablet (40 mg total) by mouth daily. 90 tablet 3   Blood Pressure Monitoring (BLOOD PRESSURE KIT) DEVI 1 kit by Does not apply route 2 (two) times daily. 1 each 0   diclofenac Sodium (VOLTAREN) 1 % GEL Apply 2 g topically 4 (four) times daily. 100 g 1   gabapentin (NEURONTIN) 300 MG capsule Take 300 mg by mouth 3 (three) times daily.     methocarbamol (ROBAXIN) 500 MG tablet Take 1 tablet (500 mg total) by mouth 2 (two) times daily. (Patient not taking: Reported on 07/31/2022) 20 tablet 0   nicotine ([facility] NICOTINE) 7 mg/24hr patch Place 1 patch (7 mg total) onto the skin daily. (Patient not  taking: Reported on 07/31/2022) 14 patch 0   nicotine (NICOTINE STEP 1) 21 mg/24hr patch Place 1 patch (21 mg total) onto the skin daily. (Patient not taking: Reported on 07/31/2022) 42 patch 0   nicotine (NICOTINE STEP 2) 14 mg/24hr patch Place 1 patch (14 mg total) onto the skin daily. (Patient not taking: Reported on 07/31/2022) 14 patch 0   nicotine polacrilex (NICORETTE) 4 MG gum Take 1 each (4 mg total) by mouth as needed for smoking cessation. (Patient not taking: Reported on 07/31/2022) 100 tablet 1   valsartan-hydrochlorothiazide (DIOVAN-HCT) 320-12.5 MG tablet Take 1 tablet by mouth daily. 90 tablet 3   No current facility-administered medications on file prior to visit.    No Known Allergies  There were no vitals taken for this visit.   Assessment/Plan:  1. Hypertension -  No problem-specific Assessment & Plan notes found for this encounter.     Thank you  Olene Floss, Pharm.D, BCPS, CPP [facility] [address] [place], [place] [address]  Phone: [phone]; Fax: [phone]

## 2022-08-02 ENCOUNTER — Encounter: Payer: Self-pay | Admitting: Interventional Cardiology

## 2022-11-06 ENCOUNTER — Other Ambulatory Visit: Payer: Self-pay | Admitting: *Deleted

## 2022-11-06 DIAGNOSIS — I77819 Aortic ectasia, unspecified site: Secondary | ICD-10-CM

## 2022-12-24 ENCOUNTER — Other Ambulatory Visit: Payer: Self-pay | Admitting: Emergency Medicine

## 2022-12-24 DIAGNOSIS — I10 Essential (primary) hypertension: Secondary | ICD-10-CM

## 2022-12-24 DIAGNOSIS — F17200 Nicotine dependence, unspecified, uncomplicated: Secondary | ICD-10-CM

## 2023-01-31 ENCOUNTER — Other Ambulatory Visit: Payer: Self-pay | Admitting: Emergency Medicine

## 2023-01-31 DIAGNOSIS — I10 Essential (primary) hypertension: Secondary | ICD-10-CM

## 2023-02-12 ENCOUNTER — Other Ambulatory Visit: Payer: Self-pay | Admitting: Emergency Medicine

## 2023-02-12 DIAGNOSIS — F17200 Nicotine dependence, unspecified, uncomplicated: Secondary | ICD-10-CM

## 2023-04-26 ENCOUNTER — Ambulatory Visit: Payer: Self-pay | Admitting: Student

## 2023-05-03 ENCOUNTER — Ambulatory Visit: Payer: Self-pay | Admitting: Student

## 2023-05-03 ENCOUNTER — Encounter: Payer: Self-pay | Admitting: Student

## 2023-05-03 VITALS — BP 120/60 | HR 73 | Ht 71.0 in | Wt 177.6 lb

## 2023-05-03 DIAGNOSIS — I10 Essential (primary) hypertension: Secondary | ICD-10-CM

## 2023-05-03 DIAGNOSIS — F17200 Nicotine dependence, unspecified, uncomplicated: Secondary | ICD-10-CM

## 2023-05-03 DIAGNOSIS — I2510 Atherosclerotic heart disease of native coronary artery without angina pectoris: Secondary | ICD-10-CM

## 2023-05-03 NOTE — Assessment & Plan Note (Signed)
Current smoker. >40 pack years. Meets LDCT screening, however no insurance. Discussed cessation. Will discuss screening once insurance obtained.

## 2023-05-03 NOTE — Assessment & Plan Note (Signed)
Well controlled -Continue amlodipine 10 mg, Valsartan-hydrochlorothiazide 320-12.5 daily

## 2023-05-03 NOTE — Assessment & Plan Note (Signed)
-  Continue Lipitor 40 mg daily ?

## 2023-05-03 NOTE — Progress Notes (Signed)
    SUBJECTIVE:   CHIEF COMPLAINT / HPI: New Patient Visit  Reviewed history tabs and updated.  HTN HLD Taking his amlodipine 10 mg, Valsartan-hydrochlorothiazide daily and lipitor 40 daily. No side-effects. BP controlled at home, checks BP intermittintely.  Tobacco: .5-1 packs per day. Started when he was [age] years old. Always been 0.5-1 ppd.  Alcohol: Daily alcohol, 3-4 cans a day Illicit drugs: None  OBJECTIVE:   BP 120/60   Pulse 73   Ht 5\' 11"  (1.803 m)   Wt 177 lb 9.6 oz (80.6 kg)   SpO2 95%   BMI 24.77 kg/m    General: NAD, pleasant Cardio: RRR, no MRG. Cap Refill <2s. Respiratory: CTAB, normal wob on RA GI: Abdomen is soft, not tender, not distended. BS present Skin: Warm and dry  ASSESSMENT/PLAN:   Assessment & Plan Essential hypertension Well controlled -Continue amlodipine 10 mg, Valsartan-hydrochlorothiazide 320-12.5 daily Atherosclerosis of native coronary artery of native heart without angina pectoris Continue Lipitor 40 mg daily Tobacco use disorder Current smoker. >40 pack years. Meets LDCT screening, however no insurance. Discussed cessation. Will discuss screening once insurance obtained.   Defer annual labs and vaccinations until instuance acquired. Patient will apply for medicaid [phone] ordered to help with navigation- patient agreeable to this). May be able to go full time at his job for insurance.   [name], DO [facility]

## 2023-05-03 NOTE — Patient Instructions (Signed)
It was great to see you! Thank you for allowing me to participate in your care!   I recommend that you always bring your medications to each appointment as this makes it easy to ensure we are on the correct medications and helps [place] not miss when refills are needed.  Our plans for today:  - I have sent a referral for our social worker to give you a call to help with navigating the Medicaid process. - Continue to take your Amlodipine, Valsartan-hydrochlorothiazide, and Lipitor.  - Follow-up once you obtain medicaid, or sooner if needed so we can update screening and vaccines   Take care and seek immediate care sooner if you develop any concerns. Please remember to show up 15 minutes before your scheduled appointment time!  [name], DO [facility]

## 2023-05-07 ENCOUNTER — Telehealth: Payer: Self-pay | Admitting: *Deleted

## 2023-05-07 NOTE — Progress Notes (Signed)
Complex Care Management Note Care Guide Note  05/07/2023 Name: Eric Nielsen MRN: [id number] DOB: 01-Sep-1960   Complex Care Management Outreach Attempts: An unsuccessful telephone outreach was attempted today to offer the patient information about available complex care management services.  Follow Up Plan:  Additional outreach attempts will be made to offer the patient complex care management information and services.   Encounter Outcome:  No Answer  Gwenevere Ghazi  [facility]  [facility], [facility] Guide  Direct Dial: [phone]  Fax [phone]

## 2023-05-07 NOTE — Progress Notes (Signed)
Complex Care Management Note  Care Guide Note 05/07/2023 Name: Eric Nielsen MRN: [id number] DOB: [birth year]  Jama Karasik is a 63 y.o. year old male who sees [name], [place] for primary care. I reached out to [employer] by phone today to offer complex care management services.  [name] was given information about Complex Care Management services today including:   The Complex Care Management services include support from the care team which includes your Nurse Care Manager, Clinical Social Worker, or Pharmacist.  The Complex Care Management team is here to help remove barriers to the health concerns and goals most important to you. Complex Care Management services are voluntary, and the patient may decline or stop services at any time by request to their care team member.   Complex Care Management Consent Status: Patient agreed to services and verbal consent obtained.   Follow up plan:  Telephone appointment with complex care management team member scheduled for:  2/21  Encounter Outcome:  Patient Scheduled  Gwenevere Ghazi  [facility]  [facility], [facility] Guide  Direct Dial: [phone]  Fax [phone]

## 2023-05-11 ENCOUNTER — Ambulatory Visit: Payer: Self-pay | Admitting: Licensed Clinical Social Worker

## 2023-05-11 NOTE — Patient Instructions (Signed)
 Visit Information  Thank you for taking time to visit with me today. Please don't hesitate to contact me if I can be of assistance to you.   Following are the goals we discussed today:   Goals Addressed   None        Please call the care guide team at [phone] if you need to cancel or reschedule your appointment.   If you are experiencing a Mental Health or Behavioral Health Crisis or need someone to talk to, please call 911   Patient verbalizes understanding of instructions and care plan provided today and agrees to view in MyChart. Active MyChart status and patient understanding of how to access instructions and care plan via MyChart confirmed with patient.     The patient has been provided with contact information for the care management team and has been advised to call with any health related questions or concerns.   Gwyndolyn Saxon MSW, LCSW Licensed Clinical Social Worker  [facility], Population Health Direct Dial: [phone]  Fax: [phone]

## 2023-05-11 NOTE — Patient Outreach (Signed)
Care Coordination   Initial Visit Note   05/11/2023 Name: Eric Nielsen MRN: [id number] DOB: 1960/10/16  Eric Nielsen is a 63 y.o. year old male who sees Eric Nielsen, [place] for primary care. I spoke with  Eric Nielsen by phone today.  What matters to the patients health and wellness today?  Pt needed assistance with [place] medicaid     Goals Addressed   None     SDOH assessments and interventions completed:  Yes  SDOH Interventions Today    Flowsheet Row Most Recent Value  SDOH Interventions   Food Insecurity Interventions Intervention Not Indicated  Housing Interventions Intervention Not Indicated  Transportation Interventions Intervention Not Indicated  Utilities Interventions Intervention Not Indicated        Care Coordination Interventions:  Yes, provided  Interventions Today    Flowsheet Row Most Recent Value  General Interventions   General Interventions Discussed/Reviewed Community Resources  [facility] Medicaid]       Follow up plan: No further intervention required.   Encounter Outcome:  Patient Visit Completed   Eric Nielsen MSW, LCSW Licensed Clinical Social Worker  [facility], Population Health Direct Dial: [phone]  Fax: [phone]

## 2023-05-14 ENCOUNTER — Encounter: Payer: Self-pay | Admitting: Licensed Clinical Social Worker

## 2023-08-01 ENCOUNTER — Other Ambulatory Visit: Payer: Self-pay | Admitting: Interventional Cardiology

## 2023-08-16 ENCOUNTER — Ambulatory Visit: Payer: Self-pay | Admitting: Cardiology

## 2023-08-27 ENCOUNTER — Other Ambulatory Visit: Payer: Self-pay | Admitting: Cardiology

## 2023-09-06 ENCOUNTER — Telehealth: Payer: Self-pay | Admitting: Emergency Medicine

## 2023-09-06 MED ORDER — VALSARTAN-HYDROCHLOROTHIAZIDE 320-12.5 MG PO TABS: 1.0000 | ORAL_TABLET | Freq: Every day | ORAL | 0 refills | Status: DC

## 2023-09-06 NOTE — Telephone Encounter (Signed)
 RX sent to requested Pharmacy

## 2023-09-06 NOTE — Telephone Encounter (Signed)
*  STAT* If patient is at the pharmacy, call can be transferred to refill team.   1. Which medications need to be refilled? (please list name of each medication and dose if known) valsartan -hydrochlorothiazide  (DIOVAN -HCT) 320-12.5 MG tablet    4. Which pharmacy/location (including street and city if local pharmacy) is medication to be sent to? [facility] - [place], [place] - [address]    5. Do they need a 30 day or 90 day supply? 90  Pt scheduled for 09/17/23

## 2023-09-17 ENCOUNTER — Telehealth (HOSPITAL_BASED_OUTPATIENT_CLINIC_OR_DEPARTMENT_OTHER): Payer: Self-pay | Admitting: Licensed Clinical Social Worker

## 2023-09-17 ENCOUNTER — Ambulatory Visit: Payer: Self-pay | Attending: Emergency Medicine | Admitting: Emergency Medicine

## 2023-09-17 ENCOUNTER — Encounter: Payer: Self-pay | Admitting: Emergency Medicine

## 2023-09-17 VITALS — BP 112/58 | HR 56 | Ht 71.0 in | Wt 173.0 lb

## 2023-09-17 DIAGNOSIS — I700 Atherosclerosis of aorta: Secondary | ICD-10-CM

## 2023-09-17 DIAGNOSIS — I10 Essential (primary) hypertension: Secondary | ICD-10-CM

## 2023-09-17 DIAGNOSIS — I351 Nonrheumatic aortic (valve) insufficiency: Secondary | ICD-10-CM

## 2023-09-17 DIAGNOSIS — I77819 Aortic ectasia, unspecified site: Secondary | ICD-10-CM

## 2023-09-17 DIAGNOSIS — E785 Hyperlipidemia, unspecified: Secondary | ICD-10-CM

## 2023-09-17 DIAGNOSIS — I2510 Atherosclerotic heart disease of native coronary artery without angina pectoris: Secondary | ICD-10-CM

## 2023-09-17 DIAGNOSIS — F17200 Nicotine dependence, unspecified, uncomplicated: Secondary | ICD-10-CM

## 2023-09-17 NOTE — Progress Notes (Signed)
 Cardiology Office Note:    Date:  09/17/2023  ID:  Eric Nielsen, DOB Mar 14, 1961, MRN [id number] PCP: Howell Lunger, DO   HeartCare Providers Cardiologist:  Madonna Large, DO       Patient Profile:       Chief Complaint: 1 year follow-up History of Present Illness:  Eric Nielsen is a 63 y.o. male with visit-pertinent history of hypertension aortic valve regurgitation, hyperlipidemia  Patient established with cardiology service on 02/23/2022 for hypertension.  This blood pressure was elevated 142/62.  His medication management was continued.  He was to follow-up with Pharm.D. in 1 month for blood pressure check but never went.  He did undergo echocardiogram on 05/08/2022 with LVEF of 70 to 75%, no RWMA, normal diastolic parameters, RV function and size normal, trivial MR, mild to moderate aortic valve regurgitation, mild dilation of ascending aorta measuring 41 mm.  He was last seen in office on 07/10/2022 by Pharm.D. for hypertension clinic.  His blood pressure was elevated above goal at 136/68.  His valsartan /HCTZ was increased to 320/12.5 mg daily.  He was continued on amlodipine  10 mg daily.   Discussed the use of AI scribe software for clinical note transcription with the patient, who gave verbal consent to proceed.  History of Present Illness Eric Nielsen is a 63 year old male with hypertension and hyperlipidemia who presents for follow-up of his cardiovascular health.  Today patient is doing well overall.  He is without acute cardiovascular concern or complaint today.  He denies any exertional symptoms.  He was last seen in December 2023. He takes amlodipine  and Diovan  for blood pressure and atorvastatin  for cholesterol. He does not monitor blood pressure at home but adheres to his medication regimen. He experiences no chest pain, dyspnea, palpitations, orthopnea, PND, lightheadedness/dizziness, or syncope. 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 He is self-paying for medical expenses and is open to Medicaid assistance.   Review of systems:  Please see the history of present illness. All other systems are reviewed and otherwise negative.      Studies Reviewed:    EKG Interpretation Date/Time:  Monday September 17 2023 08:45:09 EDT Ventricular Rate:  56 PR Interval:  152 QRS Duration:  90 QT Interval:  428 QTC Calculation: 413 R Axis:   11  Text Interpretation: Sinus bradycardia Septal infarct , age undetermined Confirmed by Rana Dixon [phone]) on 09/17/2023 9:48:50 AM    Echocardiogram 05/08/2022 1. Left ventricular ejection fraction, by estimation, is 70 to 75%. The  left ventricle has hyperdynamic function. The left ventricle has no  regional wall motion abnormalities. Left ventricular diastolic parameters  were normal.   2. Right ventricular systolic function is normal. The right ventricular  size is normal.   3. The mitral valve is normal in structure. Trivial mitral valve  regurgitation.   4. The aortic valve is tricuspid. Aortic valve regurgitation is mild to  moderate.   5. Aortic dilatation noted. There is mild dilatation of the ascending  aorta, measuring 41 mm.    Risk Assessment/Calculations:              Physical Exam:   VS:  BP (!) 112/58   Pulse (!) 56   Ht 5' 11 (1.803 m)   Wt 173 lb (78.5 kg)   SpO2 98%   BMI 24.13 kg/m    Wt Readings from Last 3 Encounters:  09/17/23 173 lb (78.5 kg)  05/03/23  177 lb 9.6 oz (80.6 kg)  07/31/22 174 lb 8 oz (79.2 kg)    GEN: Well nourished, well developed in no acute distress NECK: No JVD; No carotid bruits CARDIAC: RRR.  2/6 murmur.  No rubs, gallops RESPIRATORY:  Clear to auscultation without rales, wheezing or rhonchi  ABDOMEN: Soft, non-tender, non-distended EXTREMITIES:  No edema; No acute deformity      Assessment and Plan:  Hypertension Blood pressure today is 112/58 and well-controlled - Encouraged patient  to begin taking blood pressure at home - Continue amlodipine  10 mg daily and valsartan -hydrochlorothiazide  320-12.5 mg daily  Coronary artery calcification Hyperlipidemia, LDL goal <70 Aortic atherosclerosis CT chest lung cancer screening on 07/2019 showed multivessel coronary artery atherosclerosis and aortic atherosclerosis LDL 35 on 07/2022 and well-controlled - EKG today without acute ischemic changes - Today patient is without any anginal symptoms.  Remains active without exertional chest pains.  There is no indication for ischemic evaluation at this time - Considered starting aspirin  daily, patient deferred at this time - Continue atorvastatin  40 mg daily - Recommend DASH diet (high in vegetables, fruits, low-fat dairy products, whole grains, poultry, fish, and nuts and low in sweets, sugar-sweetened beverages, and red meats), salt restriction and increase physical activity.   Aortic insufficiency Echocardiogram 04/2022 with mild to moderate aortic valve regurgitation - There is no indication for intervention at this time.  He remains asymptomatic without chest pains, shortness of breath, syncope - Plan for echocardiogram today for routine surveillance  Ascending aorta dilation Echocardiogram 04/2022 showed mild dilation of ascending aorta measuring 41 mm CT angio chest previously ordered for surveillance.  However given patient is self-pay he deferred - Will plan to monitor size of his aorta with echocardiogram today as part of his routine surveillance for his aortic insufficiency.  If there is increase in size can plan for CT angio at that time  Tobacco use Currently smoking 6 to 7 cigarettes daily - Complete cessation encouraged      Dispo:  Return in about 1 year (around 09/16/2024).  Former Dr. Dann patient.  Will have patient establish care with Dr. Michele  Signed, Lum LITTIE Louis, NP

## 2023-09-17 NOTE — Progress Notes (Signed)
 Heart and Vascular Care Navigation  09/17/2023  Eric Nielsen 1961/03/14 [id number]  Reason for Referral: noted as self- pay, pt interested in Medicaid Patient is participating in a Managed Medicaid Plan:No, self pay only  Engaged with patient by telephone for initial visit for Heart and Vascular Care Coordination.                                                                                                   Assessment:                                     LCSW called number pt provided this morning at appt at [facility]- [phone] is number for his wife, unfortunately I do not have DPR on file. Pt wife shares she is at work, not near pt, provided his number as [phone]. Was able to reach pt there. Introduced self, role, reason for call. Confirmed home address, PCP, and shared he should complete DPR next time at clinic if able. Pt and pt wife employed both part time. He has not applied for Medicaid, is okay with a telephonic referral to complete application as it is his day off. Denied any issues with food, housing, utilities or transportation at this time. Able to get his medications at this time.   LCSW sent referral to Tinnie Devonshire, [place] DSS. Noted that [employer] benefits are pulling on WAM and pt may have marketplace coverage? Will f/u once we have received updates from DSS.   HRT/VAS Care Coordination     Patients Home Cardiology Office --  [facility]   Outpatient Care Team Social Worker   Social Worker Name: Marit Lark, [place], [phone]   Living arrangements for the past 2 months Single Family Home   Lives with: Spouse   Patient Current Insurance Coverage Self-Pay   Patient Has Concern With Paying Medical Bills Yes   Patient Concerns With Medical Bills pt noted as self- pay   Medical Bill Referrals: Medicaid referral   Does Patient Have Prescription Coverage? Yes  does appear to maybe have marketplace coverage? [employer] pulling at this time    Home Assistive Devices/Equipment None       Social History:                                                                             SDOH Screenings   Food Insecurity: No Food Insecurity (09/17/2023)  Housing: Low Risk  (09/17/2023)  Transportation Needs: No Transportation Needs (09/17/2023)  Utilities: Not At Risk (09/17/2023)  Depression (PHQ2-9): Low Risk  (05/03/2023)  Financial Resource Strain: Low Risk  (09/17/2023)  Tobacco Use: High Risk (09/17/2023)  Health Literacy: Adequate Health Literacy (09/17/2023)    SDOH Interventions: Financial Resources:  Financial Strain Interventions: [place] DSS for financial assistance and Editor, commissioning for [employer] Program  Food Insecurity:  Food Insecurity Interventions: Intervention Not Indicated  Housing Insecurity:  Housing Interventions: Intervention Not Indicated  Transportation:   Transportation Interventions: Intervention Not Indicated     Other Care Navigation Interventions:     Provided Pharmacy assistance resources  Pt noted to have [employer] benefits pulling at this time- unclear if Marketplace plan or Medicaid   Follow-up plan:   Referral made for pt and pt wife to be assessed for Medicaid. May have benefits from marketplace plan per [facility]. If no coverage and not medicaid eligible then we will discuss [employer] and other assistance programs, encouraged pt to call me as needed.

## 2023-09-17 NOTE — Patient Instructions (Addendum)
 Medication Instructions:  NO CHANGES  Lab Work: NONE If you have labs (blood work) drawn today and your tests are completely normal, you will receive your results only by: MyChart Message (if you have MyChart) OR A paper copy in the mail If you have any lab test that is abnormal or we need to change your treatment, we will call you to review the results.  Testing/Procedures: Your physician has requested that you have an echocardiogram. Echocardiography is a painless test that uses sound waves to create images of your heart. It provides your doctor with information about the size and shape of your heart and how well your heart's chambers and valves are working. This procedure takes approximately one hour. There are no restrictions for this procedure. Please do NOT wear cologne, perfume, aftershave, or lotions (deodorant is allowed). Please arrive 15 minutes prior to your appointment time.  Please note: We ask at that you not bring children with you during ultrasound (echo/ vascular) testing. Due to room size and safety concerns, children are not allowed in the ultrasound rooms during exams. Our front office staff cannot provide observation of children in our lobby area while testing is being conducted. An adult accompanying a patient to their appointment will only be allowed in the ultrasound room at the discretion of the ultrasound technician under special circumstances. We apologize for any inconvenience.   Follow-Up: At [facility], you and your health needs are our priority.  As part of our continuing mission to provide you with exceptional heart care, our providers are all part of one team.  This team includes your primary Cardiologist (physician) and Advanced Practice Providers or APPs (Physician Assistants and Nurse Practitioners) who all work together to provide you with the care you need, when you need it.  Your next appointment:   1 YEAR  Provider:   DR. MICHELE, MD (AS NEW  PATIENT)  We recommend signing up for the patient portal called MyChart.  Sign up information is provided on this After Visit Summary.  MyChart is used to connect with patients for Virtual Visits (Telemedicine).  Patients are able to view lab/test results, encounter notes, upcoming appointments, etc.  Non-urgent messages can be sent to your provider as well.   To learn more about what you can do with MyChart, go to [email or web address].

## 2023-09-25 ENCOUNTER — Other Ambulatory Visit: Payer: Self-pay | Admitting: Interventional Cardiology

## 2023-09-26 ENCOUNTER — Telehealth: Payer: Self-pay | Admitting: Cardiology

## 2023-09-26 NOTE — Telephone Encounter (Signed)
*  STAT* If patient is at the pharmacy, call can be transferred to refill team.   1. Which medications need to be refilled? (please list name of each medication and dose if known) amLODipine  (NORVASC ) 10 MG tablet   valsartan -hydrochlorothiazide  (DIOVAN -HCT) 320-12.5 MG tablet   2. Which pharmacy/location (including street and city if local pharmacy) is medication to be sent to? [facility] - [place], [place] - [address]   3. Do they need a 30 day or 90 day supply? 90

## 2023-09-27 MED ORDER — VALSARTAN-HYDROCHLOROTHIAZIDE 320-12.5 MG PO TABS: 1.0000 | ORAL_TABLET | Freq: Every day | ORAL | 3 refills | Status: AC

## 2023-09-27 NOTE — Telephone Encounter (Signed)
 Pt's medication was sent to pt's pharmacy as requested. Confirmation received.

## 2023-10-04 ENCOUNTER — Telehealth: Payer: Self-pay | Admitting: Licensed Clinical Social Worker

## 2023-10-04 NOTE — Telephone Encounter (Signed)
 H&V Care Navigation CSW Progress Note  Clinical Social Worker contacted patient by phone to f/u on Medicaid referral, no answer at [phone] (other number is spouse's number). Voicemail not set, will re-attempt pt again.  Patient is participating in a Managed Medicaid Plan:  No, self pay (possibly has Animal nutritionist per pharmacy benefits)  SDOH Screenings   Food Insecurity: No Food Insecurity (09/17/2023)  Housing: Low Risk  (09/17/2023)  Transportation Needs: No Transportation Needs (09/17/2023)  Utilities: Not At Risk (09/17/2023)  Depression (PHQ2-9): Low Risk  (05/03/2023)  Financial Resource Strain: Low Risk  (09/17/2023)  Tobacco Use: High Risk (09/17/2023)  Health Literacy: Adequate Health Literacy (09/17/2023)     Marit Lark, MSW, LCSW Clinical Social Worker II [facility] Health Heart/Vascular Care Navigation  [phone] work cell phone (preferred)

## 2023-10-09 ENCOUNTER — Telehealth (HOSPITAL_BASED_OUTPATIENT_CLINIC_OR_DEPARTMENT_OTHER): Payer: Self-pay | Admitting: Licensed Clinical Social Worker

## 2023-10-09 NOTE — Telephone Encounter (Signed)
 H&V Care Navigation CSW Progress Note  Clinical Social Worker contacted patient by phone to f/u on Medicaid referral, no answer again at [phone] (other number is spouse's number which she requested I call pt directly). Voicemail not set up still, remain available should pt re-attempt me for any questions/concerns.    Patient is participating in a Managed Medicaid Plan:  No, self pay only (possibly has Animal nutritionist)  SDOH Screenings   Food Insecurity: No Food Insecurity (09/17/2023)  Housing: Low Risk  (09/17/2023)  Transportation Needs: No Transportation Needs (09/17/2023)  Utilities: Not At Risk (09/17/2023)  Depression (PHQ2-9): Low Risk  (05/03/2023)  Financial Resource Strain: Low Risk  (09/17/2023)  Tobacco Use: High Risk (09/17/2023)  Health Literacy: Adequate Health Literacy (09/17/2023)    Marit Lark, MSW, LCSW Clinical Social Worker II [facility] Health Heart/Vascular Care Navigation  [phone]- work cell phone (preferred)

## 2023-11-01 ENCOUNTER — Ambulatory Visit (HOSPITAL_COMMUNITY)
Admission: RE | Admit: 2023-11-01 | Discharge: 2023-11-01 | Disposition: A | Discharge status: Home / Self Care | Payer: MEDICAID | Source: Ambulatory Visit | Attending: Cardiology | Admitting: Cardiology

## 2023-11-01 DIAGNOSIS — I10 Essential (primary) hypertension: Secondary | ICD-10-CM

## 2023-11-01 DIAGNOSIS — I77819 Aortic ectasia, unspecified site: Secondary | ICD-10-CM

## 2023-11-01 DIAGNOSIS — I351 Nonrheumatic aortic (valve) insufficiency: Secondary | ICD-10-CM

## 2023-11-01 LAB — ECHOCARDIOGRAM COMPLETE
AR max vel: 1.76 cm2
AV Area VTI: 1.9 cm2
AV Area mean vel: 1.79 cm2
AV Mean grad: 10 mmHg
AV Peak grad: 21.2 mmHg
Ao pk vel: 2.3 m/s
Area-P 1/2: 4.08 cm2
P 1/2 time: 378 ms
S' Lateral: 2.8 cm

## 2023-11-02 ENCOUNTER — Ambulatory Visit: Payer: Self-pay | Admitting: Emergency Medicine

## 2023-11-06 NOTE — Telephone Encounter (Signed)
 Pt returning call

## 2024-02-01 ENCOUNTER — Other Ambulatory Visit: Payer: Self-pay | Admitting: Emergency Medicine

## 2024-02-01 DIAGNOSIS — I10 Essential (primary) hypertension: Secondary | ICD-10-CM

## 2024-03-18 ENCOUNTER — Other Ambulatory Visit: Payer: Self-pay | Admitting: Emergency Medicine

## 2024-03-18 DIAGNOSIS — F17200 Nicotine dependence, unspecified, uncomplicated: Secondary | ICD-10-CM

## 2024-03-18 DIAGNOSIS — I10 Essential (primary) hypertension: Secondary | ICD-10-CM

## 2024-03-27 ENCOUNTER — Other Ambulatory Visit: Payer: Self-pay | Admitting: Emergency Medicine

## 2024-03-27 DIAGNOSIS — F17200 Nicotine dependence, unspecified, uncomplicated: Secondary | ICD-10-CM

## 2024-03-27 DIAGNOSIS — I10 Essential (primary) hypertension: Secondary | ICD-10-CM
